# Patient Record
Sex: Female | Born: 1980 | Race: White | Hispanic: No | Marital: Married | Smoking: Never smoker
Health system: Southern US, Community
[De-identification: ages and names within clinical notes are randomized; demographics above are authoritative.]

## PROBLEM LIST (undated history)

## (undated) DIAGNOSIS — F32A Depression, unspecified: Secondary | ICD-10-CM

## (undated) HISTORY — PX: DILATION AND CURETTAGE OF UTERUS: SHX78

## (undated) HISTORY — PX: POLYPECTOMY: SHX149

---
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
## 2002-02-12 ENCOUNTER — Other Ambulatory Visit: Admission: RE | Admit: 2002-02-12 | Discharge: 2002-02-12 | Payer: Self-pay | Admitting: Gynecology

## 2002-09-26 ENCOUNTER — Encounter: Payer: Self-pay | Admitting: Emergency Medicine

## 2002-09-26 ENCOUNTER — Emergency Department (HOSPITAL_COMMUNITY): Admission: AC | Admit: 2002-09-26 | Discharge: 2002-09-26 | Payer: Self-pay

## 2006-06-30 ENCOUNTER — Ambulatory Visit (HOSPITAL_COMMUNITY): Admission: RE | Admit: 2006-06-30 | Discharge: 2006-06-30 | Payer: Self-pay | Admitting: *Deleted

## 2006-06-30 ENCOUNTER — Ambulatory Visit: Payer: Self-pay | Admitting: Vascular Surgery

## 2006-06-30 ENCOUNTER — Encounter: Payer: Self-pay | Admitting: Vascular Surgery

## 2006-12-19 ENCOUNTER — Inpatient Hospital Stay (HOSPITAL_COMMUNITY): Admission: RE | Admit: 2006-12-19 | Discharge: 2006-12-22 | Payer: Self-pay | Admitting: Obstetrics and Gynecology

## 2010-08-11 NOTE — H&P (Signed)
Jamie Oconnor, Jamie Oconnor               ACCOUNT NO.:  [id number]   MEDICAL RECORD NO.:  [id number]          PATIENT TYPE:  INP   LOCATION:  NA                            FACILITY:  [facility]   PHYSICIAN:  Guy Sandifer. Henderson Cloud, M.D. DATE OF BIRTH:  1981-01-07   DATE OF ADMISSION:  12/19/2006  DATE OF DISCHARGE:                              HISTORY & PHYSICAL   CHIEF COMPLAINT:  Large baby.   HISTORY OF PRESENT ILLNESS:  This patient is a 30 year old, married  white female, G2, P0 with an EDC of December 24, 2006.  Pregnancy has  been complicated by a false positive RPR.  Ultrasound on December 13, 2006, at 38-1/2 weeks revealed an estimated fetal weight of 9 pounds 9  ounces.  The options of management were discussed, taking into account  the anticipated growth rate of the baby and gestational age.  Her  Glucola was normal.  The patient opts for cesarean section.  Potential  risks and complications have been discussed preoperatively.   Past medical history, past surgical history, family history, obstetric  history, see prenatal history and physical.   MEDICATIONS:  Prenatal vitamins, no known drug allergies.   REVIEW OF SYSTEMS:  NEUROLOGIC:  Denies headache.  CARDIAC:  Denies  chest pain.  PULMONARY:  Denies shortness of breath.  GI:  No recent  changes in bowel habits.   PHYSICAL EXAMINATION:  Height 5 feet 9 inches, weight 288.2 pounds,  blood pressure 120/80.  LUNGS:  Clear to auscultation.  HEART:  Regular rate and rhythm.  BACK:  Without CVA tenderness.  BREASTS:  Not examined.  ABDOMEN:  Gravid, nontender.  No epigastric tenderness.  Cervix closed, thick, and high.  EXTREMITIES:  Grossly within normal limits.  NEUROLOGIC EXAM:  Grossly within normal limits.   ASSESSMENT:  Fetal macrosomia at term.   PLAN:  Cesarean section.      Guy Sandifer Henderson Cloud, M.D.  Electronically Signed     JET/MEDQ  D:  12/16/2006  T:  12/16/2006  Job:  [id number]

## 2010-08-11 NOTE — Op Note (Signed)
NAMECAITLIN, Jamie Oconnor               ACCOUNT NO.:  [id number]   MEDICAL RECORD NO.:  [id number]          PATIENT TYPE:  INP   LOCATION:  9108                          FACILITY:  [facility]   PHYSICIAN:  Guy Sandifer. Henderson Cloud, M.D. DATE OF BIRTH:  11-02-1980   DATE OF PROCEDURE:  DATE OF DISCHARGE:                               OPERATIVE REPORT   PREOPERATIVE DIAGNOSIS:  Large for gestational age baby.   POSTOPERATIVE DIAGNOSIS:  Large for gestational age baby.   PROCEDURE:  Low transverse cesarean section.   SURGEON:  Guy Sandifer. Henderson Cloud, M.D.   ANESTHESIA:  Spinal.   FINDINGS:  Viable female infant, Apgars of 9 and 9 at 1 and 5 minutes  respectively.  Birth weight 9 pounds.  Arterial cord pH pending.   ESTIMATED BLOOD LOSS:  800 cc.   INDICATIONS/CONSENT:  Patient is a 30 year old married white female, G2,  P0 with an EDC of December 24, 2006.  Ultrasound has an estimated fetal  weight of 9 pounds, 9 ounces, approximately one week ago.  After  discussion of the options, primary C-section is discussed.  Potential  risks and complications have been discussed preoperatively, including  but not limited to infection, organ damage, bleeding requiring  transfusion of blood products, possible HIV and hepatitis acquisition,  DVT, PE, pneumonia.  All questions have been answered, and consent  signed on the chart.   PROCEDURE:  Patient is taken to the operating room, where she is  identified.  A spinal anesthetic is placed, and she is placed in the  dorsal supine position with a 15 degree left lateral wedge.  She has  been prepped.  Foley catheter is placed, and the bladder is drained.  She is draped in a sterile fashion.  After assessing for adequate spinal  anesthesia, the skin is entered through a Pfannenstiel incision, and  dissection is carried out in layers in the peritoneum.  The peritoneum  is incised and taken down superiorly and inferiorly.  The vesicouterine  peritoneum is taken down  cephalolaterally.  The bladder flap is  developed, and the bladder blade is placed.  The uterus is then incised  in a low transverse manner.  The uterine cavity is entered bluntly with  a hemostat.  The uterine incision is extended cephalolaterally with the  fingers.  Artificial rupture of membranes reveals clear fluid.  The  vertex is elevated.  In order to deliver it through the incision, a  vacuum extractor is placed.  There is one pop-off with a very gentle  pull because of hair on the baby's pull.  A second very gentle attempt  easily elevates the vertex.  The oropharynx and nasopharynx are  suctioned.  The remainder of the baby is delivered.  Good cry and good  tone is noted.  The cord is clamped and cut.  The baby is handed to the  waiting pediatrics team.  The placenta is then manually delivered, and  the uterine cavity is cleaned.  The uterus is closed in two running,  locking imbricating layers with 0 Monocryl suture, which achieves good  hemostasis.  The tubes and ovaries are normal bilaterally.  The anterior  peritoneum was closed in a running fashion with a 0 Monocryl suture,  which is also used to reapproximate  the pyramidalis muscle in the midline.  Anterior rectus fascia is closed  in a running fashion with a 0 PDS suture, and the skin is closed with  clips.  All sponge and instrument counts are correct, and the patient is  transferred to the recovery room in stable condition.      Guy Sandifer Henderson Cloud, M.D.  Electronically Signed     JET/MEDQ  D:  12/19/2006  T:  12/19/2006  Job:  [id number]

## 2010-08-14 NOTE — Discharge Summary (Signed)
Jamie Oconnor, Jamie Oconnor               ACCOUNT NO.:  [id number]   MEDICAL RECORD NO.:  [id number]          PATIENT TYPE:  INP   LOCATION:  9108                          FACILITY:  [facility]   PHYSICIAN:  Freddy Finner, M.D.   DATE OF BIRTH:  [birth year]   DATE OF ADMISSION:  12/19/2006  DATE OF DISCHARGE:  12/22/2006                               DISCHARGE SUMMARY   ADMITTING DIAGNOSES:  1. Intrauterine pregnancy at term.  2. Large for gestational age infant.   DISCHARGE DIAGNOSES:  1. Status post low transverse cesarean section.  2. Viable female infant.   PROCEDURE:  Primary low transverse cesarean section.   REASON FOR ADMISSION:  Please see dictated H&P.   HOSPITAL COURSE:  The patient is a 30 year old white married female  gravida 2, para 0 that had been seen in the office approximately 1 week  prior to delivery date.  Ultrasound had been performed at that time, for  an estimated fetal weight of 9 pounds 9 ounces.  After discussion with  the patient, the decision was made to schedule a primary cesarean  delivery.   On the morning of admission, the patient was taken to the operating room  where spinal anesthesia was administered without difficulty.  A low  transverse incision was made, with delivery of a viable female infant  weighing 9 pounds; with Apgars of 9 at one minute  and 9 at 5 minutes.  The patient tolerated the procedure well and was taken to the recovery  room in stable condition.   On postoperative day #1 the patient did complain of being hungry.  Vital  signs were stable.  She was afebrile.  Abdomen was soft, with good  return of bowel function.  Fundus was firm and nontender.  Abdominal  dressing was noted to be clean, dry and intact.  Foley had been  discontinued.  She had not voided at time of rounds.  Laboratory  findings revealed hemoglobin of 9.6, platelet count 126,000, white count  of 8.1.  The patient was started on some iron supplementation. A CBC was  ordered  for the following morning.   On postoperative day #2 the patient was without complaint.  Vital signs  remained stable.  Abdomen was soft.  Fundus firm and nontender.  Incision was clean, dry and intact.  She was voiding well.   On postoperative day #3 the patient was without complaint.  Vital signs  were stable.  Abdomen was soft.  Fundus firm and nontender.  Incision  was clean, dry and intact.  Staples were removed and the patient was  later discharged home.   CONDITION ON DISCHARGE:  Stable.   DIET:  Regular as tolerated.   ACTIVITY:  No heavy lifting, no driving x2 weeks.  No vaginal entry.   FOLLOW-UP:  The patient is to follow up in the office in 1-2 weeks for  incision check.  She was to call for temperature greater than 100  degrees, persistent nausea, vomiting, heavy vaginal bleeding and/or  redness or drainage from the incisional site.   DISCHARGE MEDICATIONS:  1. Percocet  5/325 #30; one p.o. every 4-6 hours p.r.n.  2. Motrin 600 mg every 6 hours.  3. Prenatal vitamins one p.o. daily.  4. Colace one p.o. daily p.r.n.      Julio Sicks, N.P.      Freddy Finner, M.D.  Electronically Signed    CC/MEDQ  D:  01/24/2007  T:  01/25/2007  Job:  [id number]

## 2011-01-07 LAB — CBC
HCT: 28 — ABNORMAL LOW
HCT: 35.8 — ABNORMAL LOW
Hemoglobin: 12.5
Hemoglobin: 9.6 — ABNORMAL LOW
MCHC: 35
MCV: 84.6
MCV: 85.1
Platelets: 150
RBC: 4.28
WBC: 8.1

## 2011-01-07 LAB — RPR TITER: RPR Titer: 1:1 {titer}

## 2011-01-22 LAB — TPPA: Treponema Confirm: NONREACTIVE

## 2013-06-19 ENCOUNTER — Emergency Department (HOSPITAL_COMMUNITY)
Admission: EM | Admit: 2013-06-19 | Discharge: 2013-06-19 | Disposition: A | Discharge status: Home / Self Care | Payer: Self-pay | Attending: Emergency Medicine | Admitting: Emergency Medicine

## 2013-06-19 ENCOUNTER — Encounter (HOSPITAL_COMMUNITY): Payer: Self-pay | Admitting: Emergency Medicine

## 2013-06-19 DIAGNOSIS — J029 Acute pharyngitis, unspecified: Secondary | ICD-10-CM | POA: Insufficient documentation

## 2013-06-19 LAB — RAPID STREP SCREEN (MED CTR MEBANE ONLY): STREPTOCOCCUS, GROUP A SCREEN (DIRECT): NEGATIVE

## 2013-06-19 MED ORDER — SODIUM CHLORIDE 0.9 % IV BOLUS (SEPSIS)
1000.0000 mL | Freq: Once | INTRAVENOUS | Status: AC
  Administered 2013-06-19: 1000 mL via INTRAVENOUS

## 2013-06-19 MED ORDER — SODIUM CHLORIDE 0.9 % IV BOLUS (SEPSIS)
1000.0000 mL | Freq: Once | INTRAVENOUS | Status: AC
Start: 2013-06-19 — End: 2013-06-19
  Administered 2013-06-19: 1000 mL via INTRAVENOUS

## 2013-06-19 MED ORDER — MORPHINE SULFATE 4 MG/ML IJ SOLN
4.0000 mg | Freq: Once | INTRAMUSCULAR | Status: AC
  Administered 2013-06-19: 4 mg via INTRAVENOUS
  Filled 2013-06-19: qty 1

## 2013-06-19 MED ORDER — ONDANSETRON HCL 4 MG/2ML IJ SOLN
4.0000 mg | Freq: Once | INTRAMUSCULAR | Status: AC
  Administered 2013-06-19: 4 mg via INTRAVENOUS
  Filled 2013-06-19: qty 2

## 2013-06-19 MED ORDER — HYDROCODONE-ACETAMINOPHEN 7.5-325 MG/15ML PO SOLN: 10.0000 mL | Freq: Four times a day (QID) | ORAL | Status: DC | PRN

## 2013-06-19 NOTE — ED Notes (Signed)
Pt reports sore throat since Saturday. Having vomiting, fever, chills. Airway intact.

## 2013-06-19 NOTE — ED Provider Notes (Signed)
CSN: [id number]     Arrival date & time 06/19/13  [id number] History   First MD Initiated Contact with Patient 06/19/13 0915     Chief Complaint  Patient presents with  . Sore Throat  . Emesis     (Consider location/radiation/quality/duration/timing/severity/associated sxs/prior Treatment) Patient is a 33 y.o. female presenting with pharyngitis and vomiting. The history is provided by the patient. No language interpreter was used.  Sore Throat This is a new problem. Associated symptoms include a fever, myalgias, nausea, a sore throat and vomiting. Pertinent negatives include no abdominal pain, chest pain, chills, congestion or coughing. Associated symptoms comments: Significant sore throat for the past 4 days, with associated fever, nausea and vomiting. No nasal congestion or cough. No sick contacts. .  Emesis Associated symptoms: myalgias and sore throat   Associated symptoms: no abdominal pain and no chills     History reviewed. No pertinent past medical history. History reviewed. No pertinent past surgical history. History reviewed. No pertinent family history. History  Substance Use Topics  . Smoking status: Never Smoker   . Smokeless tobacco: Not on file  . Alcohol Use: No   OB History   Grav Para Term Preterm Abortions TAB SAB Ect Mult Living                 Review of Systems  Constitutional: Positive for fever. Negative for chills.  HENT: Positive for sore throat and trouble swallowing. Negative for congestion.   Respiratory: Negative.  Negative for cough and shortness of breath.   Cardiovascular: Negative.  Negative for chest pain.  Gastrointestinal: Positive for nausea and vomiting. Negative for abdominal pain.  Musculoskeletal: Positive for myalgias.  Skin: Negative.   Neurological: Negative.       Allergies  Review of patient's allergies indicates no known allergies.  Home Medications   Current Outpatient Rx  Name  Route  Sig  Dispense  Refill  .  levonorgestrel (MIRENA) 20 MCG/24HR IUD   Intrauterine   1 each by Intrauterine route once.         . Nutritional Supplements (COLD AND FLU PO)   Oral   Take 2 tablets by mouth every 6 (six) hours as needed (cold/flu).          BP 100/70  Pulse 96  Temp(Src) 98.1 F (36.7 C) (Oral)  Resp 19  Wt 260 lb (117.935 kg)  SpO2 99% Physical Exam  Constitutional: She is oriented to person, place, and time. She appears well-developed and well-nourished. No distress.  HENT:  Head: Normocephalic.  Oropharynx is erythematous without mild swelling bilaterally. Uvula midline.   Neck: Normal range of motion. Neck supple.  Cardiovascular: Normal rate and regular rhythm.   Pulmonary/Chest: Effort normal and breath sounds normal.  Abdominal: Soft. Bowel sounds are normal. There is no tenderness. There is no rebound and no guarding.  Musculoskeletal: Normal range of motion.  Neurological: She is alert and oriented to person, place, and time.  Skin: Skin is warm and dry. No rash noted.  Psychiatric: She has a normal mood and affect.    ED Course  Procedures (including critical care time) Labs Review Labs Reviewed  RAPID STREP SCREEN   Imaging Review No results found.   EKG Interpretation None      MDM   Final diagnoses:  None    1. Pharyngitis  IV fluids given with some improvement in patient's condition. VSS. Strep negative. Suspect viral process and recommend supportive care.    Ocie CornfieldShari A  Junius Finner 06/21/13 0224  Medical screening examination/treatment/procedure(s) were performed by non-physician practitioner and as supervising physician I was immediately available for consultation/collaboration.   EKG Interpretation None       Donnetta Hutching, MD 06/28/13 1600

## 2013-06-19 NOTE — Discharge Instructions (Signed)
Salt Water Gargle °This solution will help make your mouth and throat feel better. °HOME CARE INSTRUCTIONS  °· Mix 1 teaspoon of salt in 8 ounces of warm water. °· Gargle with this solution as much or often as you need or as directed. Swish and gargle gently if you have any sores or wounds in your mouth. °· Do not swallow this mixture. °Document Released: 12/18/2003 Document Revised: 06/07/2011 Document Reviewed: 05/10/2008 °[employer]® Patient Information ©2014 [employer]. ° °Sore Throat °A sore throat is pain, burning, irritation, or scratchiness of the throat. There is often pain or tenderness when swallowing or talking. A sore throat may be accompanied by other symptoms, such as coughing, sneezing, fever, and swollen neck glands. A sore throat is often the first sign of another sickness, such as a cold, flu, strep throat, or mononucleosis (commonly known as mono). Most sore throats go away without medical treatment. °CAUSES  °The most common causes of a sore throat include: °· A viral infection, such as a cold, flu, or mono. °· A bacterial infection, such as strep throat, tonsillitis, or whooping cough. °· Seasonal allergies. °· Dryness in the air. °· Irritants, such as smoke or pollution. °· Gastroesophageal reflux disease (GERD). °HOME CARE INSTRUCTIONS  °· Only take over-the-counter medicines as directed by your caregiver. °· Drink enough fluids to keep your urine clear or pale yellow. °· Rest as needed. °· Try using throat sprays, lozenges, or sucking on hard candy to ease any pain (if older than 4 years or as directed). °· Sip warm liquids, such as broth, herbal tea, or warm water with honey to relieve pain temporarily. You may also eat or drink cold or frozen liquids such as frozen ice pops. °· Gargle with salt water (mix 1 tsp salt with 8 oz of water). °· Do not smoke and avoid secondhand smoke. °· Put a cool-mist humidifier in your bedroom at night to moisten the air. You can also turn on a hot shower  and sit in the bathroom with the door closed for 5 10 minutes. °SEEK IMMEDIATE MEDICAL CARE IF: °· You have difficulty breathing. °· You are unable to swallow fluids, soft foods, or your saliva. °· You have increased swelling in the throat. °· Your sore throat does not get better in 7 days. °· You have nausea and vomiting. °· You have a fever or persistent symptoms for more than 2 3 days. °· You have a fever and your symptoms suddenly get worse. °MAKE SURE YOU:  °· Understand these instructions. °· Will watch your condition. °· Will get help right away if you are not doing well or get worse. °Document Released: 04/22/2004 Document Revised: 03/01/2012 Document Reviewed: 11/21/2011 °ExitCare® Patient Information ©2014 [employer]. ° °

## 2013-06-20 LAB — CULTURE, GROUP A STREP

## 2013-06-21 ENCOUNTER — Telehealth (HOSPITAL_COMMUNITY): Payer: Self-pay | Admitting: *Deleted

## 2013-06-21 NOTE — ED Notes (Addendum)
+   Strep Plan : Junius FinnerErin O'Malley for Amoxicillin 500 mg po BID x 10 days

## 2013-06-21 NOTE — Progress Notes (Signed)
ED Antimicrobial Stewardship Positive Culture Follow Up   Jamie Oconnor is an 33 y.o. female who [facility] with a chief complaint of  Chief Complaint  Patient presents with  . Sore Throat  . Emesis    Recent Results (from the past 720 hour(s))  RAPID STREP SCREEN     Status: None   Collection Time    06/19/13  8:54 AM      Result Value Ref Range Status   Streptococcus, Group A Screen (Direct) NEGATIVE  NEGATIVE Final   Comment: (NOTE)     A Rapid Antigen test may result negative if the antigen level in the     sample is below the detection level of this test. The FDA has not     cleared this test as a stand-alone test therefore the rapid antigen     negative result has reflexed to a Group A Strep culture.  CULTURE, GROUP A STREP     Status: None   Collection Time    06/19/13  8:54 AM      Result Value Ref Range Status   Specimen Description THROAT   Final   Special Requests NONE   Final   Culture     Final   Value: GROUP A STREP (S.PYOGENES) ISOLATED     Performed at [facility]   Report Status 06/20/2013 FINAL   Final     [x]  Patient discharged originally without antimicrobial agent and treatment is now indicated  New antibiotic prescription: amoxicillin 500mg  po BID x 10 days  ED Provider: Junius FinnerErin O'Malley PA-C   Mickeal SkinnerFrens, [place] John 06/21/2013, 9:49 AM Infectious Diseases Pharmacist Phone# [phone]

## 2013-06-24 NOTE — Telephone Encounter (Signed)
Unable to contact patient via phone. Sent letter. °

## 2013-09-20 ENCOUNTER — Telehealth (HOSPITAL_BASED_OUTPATIENT_CLINIC_OR_DEPARTMENT_OTHER): Payer: Self-pay | Admitting: Emergency Medicine

## 2015-06-27 ENCOUNTER — Emergency Department (HOSPITAL_COMMUNITY)
Admission: EM | Admit: 2015-06-27 | Discharge: 2015-06-28 | Disposition: A | Discharge status: Home / Self Care | Payer: BLUE CROSS/BLUE SHIELD | Attending: Emergency Medicine | Admitting: Emergency Medicine

## 2015-06-27 ENCOUNTER — Emergency Department (HOSPITAL_COMMUNITY): Payer: BLUE CROSS/BLUE SHIELD

## 2015-06-27 ENCOUNTER — Encounter (HOSPITAL_COMMUNITY): Payer: Self-pay | Admitting: Family Medicine

## 2015-06-27 DIAGNOSIS — R079 Chest pain, unspecified: Secondary | ICD-10-CM | POA: Insufficient documentation

## 2015-06-27 DIAGNOSIS — M7981 Nontraumatic hematoma of soft tissue: Secondary | ICD-10-CM | POA: Insufficient documentation

## 2015-06-27 DIAGNOSIS — K2970 Gastritis, unspecified, without bleeding: Secondary | ICD-10-CM | POA: Insufficient documentation

## 2015-06-27 DIAGNOSIS — Z3202 Encounter for pregnancy test, result negative: Secondary | ICD-10-CM | POA: Insufficient documentation

## 2015-06-27 DIAGNOSIS — R05 Cough: Secondary | ICD-10-CM

## 2015-06-27 DIAGNOSIS — R319 Hematuria, unspecified: Secondary | ICD-10-CM | POA: Diagnosis not present

## 2015-06-27 DIAGNOSIS — R059 Cough, unspecified: Secondary | ICD-10-CM

## 2015-06-27 DIAGNOSIS — R1084 Generalized abdominal pain: Secondary | ICD-10-CM

## 2015-06-27 DIAGNOSIS — R112 Nausea with vomiting, unspecified: Secondary | ICD-10-CM | POA: Diagnosis present

## 2015-06-27 LAB — COMPREHENSIVE METABOLIC PANEL
ALT: 21 U/L (ref 14–54)
ANION GAP: 14 (ref 5–15)
AST: 25 U/L (ref 15–41)
Albumin: 4 g/dL (ref 3.5–5.0)
Alkaline Phosphatase: 64 U/L (ref 38–126)
BILIRUBIN TOTAL: 0.4 mg/dL (ref 0.3–1.2)
BUN: 12 mg/dL (ref 6–20)
CHLORIDE: 96 mmol/L — AB (ref 101–111)
CO2: 25 mmol/L (ref 22–32)
Calcium: 9.1 mg/dL (ref 8.9–10.3)
Creatinine, Ser: 0.95 mg/dL (ref 0.44–1.00)
Glucose, Bld: 142 mg/dL — ABNORMAL HIGH (ref 65–99)
POTASSIUM: 3.1 mmol/L — AB (ref 3.5–5.1)
Sodium: 135 mmol/L (ref 135–145)
TOTAL PROTEIN: 8.5 g/dL — AB (ref 6.5–8.1)

## 2015-06-27 LAB — URINE MICROSCOPIC-ADD ON

## 2015-06-27 LAB — URINALYSIS, ROUTINE W REFLEX MICROSCOPIC
Bilirubin Urine: NEGATIVE
GLUCOSE, UA: NEGATIVE mg/dL
KETONES UR: NEGATIVE mg/dL
LEUKOCYTES UA: NEGATIVE
NITRITE: NEGATIVE
PH: 6 (ref 5.0–8.0)
Protein, ur: 30 mg/dL — AB
SPECIFIC GRAVITY, URINE: 1.026 (ref 1.005–1.030)

## 2015-06-27 LAB — CBC
HEMATOCRIT: 44.4 % (ref 36.0–46.0)
HEMOGLOBIN: 15.3 g/dL — AB (ref 12.0–15.0)
MCH: 29.8 pg (ref 26.0–34.0)
MCHC: 34.5 g/dL (ref 30.0–36.0)
MCV: 86.5 fL (ref 78.0–100.0)
Platelets: 214 10*3/uL (ref 150–400)
RBC: 5.13 MIL/uL — AB (ref 3.87–5.11)
RDW: 12.9 % (ref 11.5–15.5)
WBC: 8.6 10*3/uL (ref 4.0–10.5)

## 2015-06-27 LAB — I-STAT BETA HCG BLOOD, ED (MC, WL, AP ONLY)

## 2015-06-27 LAB — LIPASE, BLOOD: LIPASE: 29 U/L (ref 11–51)

## 2015-06-27 MED ORDER — ONDANSETRON 4 MG PO TBDP
4.0000 mg | ORAL_TABLET | Freq: Once | ORAL | Status: AC | PRN
  Administered 2015-06-27: 4 mg via ORAL

## 2015-06-27 MED ORDER — SODIUM CHLORIDE 0.9 % IV BOLUS (SEPSIS)
1000.0000 mL | Freq: Once | INTRAVENOUS | Status: AC
Start: 2015-06-27 — End: 2015-06-27
  Administered 2015-06-27: 1000 mL via INTRAVENOUS

## 2015-06-27 MED ORDER — OXYCODONE-ACETAMINOPHEN 5-325 MG PO TABS: 1.0000 | ORAL_TABLET | ORAL | Status: DC | PRN

## 2015-06-27 MED ORDER — MORPHINE SULFATE (PF) 4 MG/ML IV SOLN
4.0000 mg | Freq: Once | INTRAVENOUS | Status: AC
  Administered 2015-06-27: 4 mg via INTRAVENOUS
  Filled 2015-06-27: qty 1

## 2015-06-27 MED ORDER — DICYCLOMINE HCL 20 MG PO TABS: 20.0000 mg | ORAL_TABLET | Freq: Two times a day (BID) | ORAL | Status: DC

## 2015-06-27 MED ORDER — POTASSIUM CHLORIDE CRYS ER 20 MEQ PO TBCR
40.0000 meq | EXTENDED_RELEASE_TABLET | Freq: Once | ORAL | Status: AC
  Administered 2015-06-27: 40 meq via ORAL
  Filled 2015-06-27: qty 2

## 2015-06-27 MED ORDER — MORPHINE SULFATE (PF) 4 MG/ML IV SOLN
6.0000 mg | Freq: Once | INTRAVENOUS | Status: AC
  Administered 2015-06-27: 6 mg via INTRAVENOUS
  Filled 2015-06-27: qty 2

## 2015-06-27 MED ORDER — ACETAMINOPHEN 325 MG PO TABS
650.0000 mg | ORAL_TABLET | Freq: Once | ORAL | Status: AC
  Administered 2015-06-27: 650 mg via ORAL
  Filled 2015-06-27: qty 2

## 2015-06-27 MED ORDER — ONDANSETRON 4 MG PO TBDP: 4.0000 mg | ORAL_TABLET | Freq: Three times a day (TID) | ORAL | Status: DC | PRN

## 2015-06-27 MED ORDER — ONDANSETRON HCL 4 MG/2ML IJ SOLN
4.0000 mg | Freq: Once | INTRAMUSCULAR | Status: AC
  Administered 2015-06-27: 4 mg via INTRAVENOUS
  Filled 2015-06-27: qty 2

## 2015-06-27 MED ORDER — SODIUM CHLORIDE 0.9 % IV BOLUS (SEPSIS)
1000.0000 mL | Freq: Once | INTRAVENOUS | Status: AC
  Administered 2015-06-27: 1000 mL via INTRAVENOUS

## 2015-06-27 MED ORDER — FAMOTIDINE IN NACL 20-0.9 MG/50ML-% IV SOLN
20.0000 mg | Freq: Once | INTRAVENOUS | Status: AC
  Administered 2015-06-27: 20 mg via INTRAVENOUS
  Filled 2015-06-27: qty 50

## 2015-06-27 MED ORDER — ONDANSETRON 4 MG PO TBDP
ORAL_TABLET | ORAL | Status: AC
  Filled 2015-06-27: qty 1

## 2015-06-27 MED ORDER — IOHEXOL 300 MG/ML  SOLN
100.0000 mL | Freq: Once | INTRAMUSCULAR | Status: AC | PRN
  Administered 2015-06-27: 100 mL via INTRAVENOUS

## 2015-06-27 MED ORDER — FAMOTIDINE 20 MG PO TABS: 20.0000 mg | ORAL_TABLET | Freq: Two times a day (BID) | ORAL | Status: DC

## 2015-06-27 MED ORDER — PANTOPRAZOLE SODIUM 20 MG PO TBEC: 20.0000 mg | DELAYED_RELEASE_TABLET | Freq: Every day | ORAL | Status: DC

## 2015-06-27 NOTE — Discharge Instructions (Signed)
You have been seen today for abdominal pain and other related symptoms. Your CAT scan showed inflammation in the stomach, which may account for her symptoms. The start of this may have been from a virus, such as influenza.  Drink plenty of fluids and get plenty of rest. You should be drinking at least a liter of water an hour to stay hydrated. Take the protonix 30 min before your first meal. Take this medication every day, even if you start to feel better. Follow up with PCP as soon as possible for reevaluation and chronic management. Return to ED should symptoms worsen.  RESOURCE GUIDE  Chronic Pain Problems: Contact [facility]  [phone] Patients need to be referred by their primary care doctor.  Insufficient Money for Medicine: Contact United Way:  call "211" or [facility] 662-560-2196.  No Primary Care Doctor: - Call [facility]  [phone] - can help you locate a primary care doctor that  accepts your insurance, provides certain services, etc. - Physician Referral Service- [phone]  Agencies that provide inexpensive medical care: - [facility]  [phone] - [facility]  [phone] - [facility]  [phone] - Women's Clinic  [phone] - Planned Parenthood  [phone] [facility]  [phone]  Medicaid-accepting [facility] Providers: - [facility]- [address]  [phone], Mon-Fri 9am-7pm, Sat 9am-1pm - [facility]- [address] [place], [address] [place]  [phone] - [facility]- [address], [address] [place]  [phone] [facility]- [address]  [phone] - Renaye Rakers- [address] [place], [address], [phone]  Only accepts [place] patients after they have their name  applied to their card  Self Pay (no insurance) in [place]: - Sickle Cell Patients: Dr Willey Blade, [facility]  Internal Medicine  [address] [place], [phone] - [facility]- [address] [place]  [phone]       [facility] [place]- [address]       -     [facility]- see information above (Speak to [employer] if you do not have insurance)       -  [facility]- [address] [place], [phone]       -  [facility]- [address],  [phone]       -  [facility]- [address] [phone]       -  Dr Julio Sicks-  [address], [place], [phone]       -  [facility]- [address] [phone]       -  [facility]- [address], [phone], also [address], [phone]       -    [facility]- [address] [place], [phone], 1st & 3rd Saturday   every month, 10am-1pm  1) Find a Doctor and Pay Out of Pocket Although you won't have to find out who is covered by your insurance plan, it is a good idea to ask around and get recommendations. You will then need to call the office and see if the doctor you have chosen will accept you as a new patient and what types of options they offer for patients who are self-pay.  Some doctors offer discounts or will set up payment plans for their patients who do not have insurance, but you will need to ask so you aren't surprised when you get to your appointment.  2) Contact Your Local Health Department Not all health departments have doctors that can see patients for sick visits, but many do, so it is worth a call to see if yours does. If you don't know where your local health department is, you can check in your phone book. The CDC also has a tool to help you locate your state's health department, and many state websites also have listings of all of their local health departments.  3) Find a Walk-in Clinic If your illness is not likely to be very severe or complicated, you may want to try a walk in clinic. These are popping up all  over the country in pharmacies, drugstores, and shopping centers. They're usually staffed by nurse practitioners or physician assistants that have been trained to treat common illnesses and complaints. They're usually fairly quick and inexpensive. However, if you have serious medical issues or chronic medical problems, these are probably not your best option  STD Testing - [facility] [place], STD Clinic, [address], [place], phone [phone] or [phone].  Monday - Friday, call for an appointment. [facility] [place] [facility] [place], STD Clinic, [place], [place], phone [phone] or [phone].  Monday - Friday, call for an appointment.  Abuse/Neglect: [facility] [place] Child Abuse Hotline 360-334-4238(336) 732-709-0907 [facility]- [place] Child Abuse Hotline 5624610231973-866-7748 (After Hours)  Emergency Shelter:  [facility] 339-246-0172(336) [phone]  Maternity Homes: - [facility] at the [place] [facility] [phone](336) [phone] - [facility] [phone]) [phone]  MRSA Hotline #:   503 838 41116672041253  [facility] Resources  [facility] [place]  United Way [facility] [address]                 [address]         [address] [place] [address]  [place]                                               [place]                              [place] Phone:  [phone]                                  Phone:  [phone]                   Phone:  [phone]  [facility], [phone] - [facility] Services - [phone]- [phone]       -     [facility] in [place], [address],                                  [phone], Insurance  [place] Child Abuse Hotline (807)674-8328(336) 613 137 7335 or 3314867013(336) 779-615-2707 (After Hours)   Behavioral Health Services  Substance Abuse Resources: - Alcohol and Drug Services  [phone] - Addiction  [facility] (740)[phone] - [facility] [place] [phone] Floydene Flock [phone] - Residential & Outpatient Substance Abuse Program  [phone]  Psychological Services: [facility]  [phone] Services  [phone] - [facility], [phone] [place]. [address] [place], ACCESS LINE: 2527144775 or (651)700-3445, [employer]  Dental Assistance  If unable to pay or uninsured, contact:  [facility] or [facility]. to become qualified for the adult dental clinic.  Patients with Medicaid: [facility] [phone] [address], [phone] [address] [phone]  If unable to pay, or uninsured, contact [facility] (660) [phone]) or [facility] [phone] in [place], [phone] in [facility]) to become qualified for the adult dental clinic   Other Low-Cost Community Dental Services: - [facility]- [address] [place], [place], [place], [address], [phone], [address]. [address], 2nd and 4th Thursday of the month at 6:30am.  10 clients each day by appointment, can sometimes see walk-in patients if someone does not show for an appointment. [facility]- [address] Ether Griffins [place], [place], [address], [phone] - [facility]- [address] [place], [place], [address], [phone] - [facility]- [phone] [facility] Health Department- [phone] [facility] Department- [phone]

## 2015-06-27 NOTE — ED Provider Notes (Signed)
CSN: [id number]     Arrival date & time 06/27/15  1707 History  By signing my name below, I, Placido SouLogan Joldersma, attest that this documentation has been prepared under the direction and in the presence of Fayrene HelperBowie Shontay Wallner, PA-C. Electronically Signed: Placido SouLogan Joldersma, ED Scribe. 06/27/2015. 7:28 PM.    Chief Complaint  Patient presents with  . Chest Pain  . Emesis  . Cough   The history is provided by the patient and the spouse. No language interpreter was used.   HPI Comments: Jamie Oconnor is a 35 y.o. female who presents to the Emergency Department complaining of constant, moderate, epigastric pain onset 4 days ago. She reports associated nausea, >10x vomiting per day with intermittent traces of blood, intermittent diarrhea, mild subjective fever (99.7 F in triage), chills, productive cough and atraumatic left breast tenderness and bruising. She denies taking any medications for her symptoms. Pt notes a SHx to her abd including a cesarean section. Pt notes that her partner was recently dx with pneumonia. She denies a hx of smoking, ETOH abuse or any known health conditions. Pt denies urinary symptoms or other associated symptoms at this time.    History reviewed. No pertinent past medical history. Past Surgical History  Procedure Laterality Date  . Cesarean section     History reviewed. No pertinent family history. Social History  Substance Use Topics  . Smoking status: Never Smoker   . Smokeless tobacco: None  . Alcohol Use: No   OB History    No data available     Review of Systems  Constitutional: Positive for fever and chills.  Respiratory: Positive for cough.   Gastrointestinal: Positive for nausea, vomiting and diarrhea.  Genitourinary: Negative for dysuria and hematuria.  Musculoskeletal: Positive for myalgias.  Skin: Positive for color change.  All other systems reviewed and are negative.  Allergies  Review of patient's allergies indicates no known allergies.  Home  Medications   Prior to Admission medications   Medication Sig Start Date End Date Taking? Authorizing Provider  HYDROcodone-acetaminophen (HYCET) 7.5-325 mg/15 ml solution Take 10 mLs by mouth 4 (four) times daily as needed for moderate pain. 06/19/13   Elpidio AnisShari Upstill, PA-C  levonorgestrel (MIRENA) 20 MCG/24HR IUD 1 each by Intrauterine route once.    Historical Provider, MD  Nutritional Supplements (COLD AND FLU PO) Take 2 tablets by mouth every 6 (six) hours as needed (cold/flu).    Historical Provider, MD   BP 148/84 mmHg  Pulse 122  Temp(Src) 99.7 F (37.6 C) (Oral)  Resp 20  SpO2 97% Physical Exam  Constitutional: She is oriented to person, place, and time. She appears well-developed and well-nourished.  HENT:  Head: Normocephalic and atraumatic.  Eyes: EOM are normal.  Neck: Normal range of motion.  Cardiovascular: Normal rate and regular rhythm.  Exam reveals no gallop and no friction rub.   No murmur heard. Pulmonary/Chest: Effort normal and breath sounds normal. No respiratory distress. She has no wheezes. She has no rales.  Abdominal: Soft. There is tenderness. There is no rebound and no guarding.  Diffuse abd tenderness without guarding or rebound  Musculoskeletal: Normal range of motion.  Faint bruising noted to medial aspect of left breast without any signs of infection  Neurological: She is alert and oriented to person, place, and time.  Skin: Skin is warm and dry.  Psychiatric: She has a normal mood and affect.  Nursing note and vitals reviewed.  ED Course  Procedures  DIAGNOSTIC STUDIES: Oxygen Saturation is  97% on RA, normal by my interpretation.    COORDINATION OF CARE: 8:05 PM Pt presents today due to abd pain.  Since pt report cough and recent sick contact, CXR ordered. She also have diffused abd pain, Discussed treatment plan with pt at bedside including Ct scan, pain medication, anti nausea medication and IV fluids. Pt agreed to plan.  10:50 PM UA shows  moderate hemoglobin on urine dipsticks however patient is currently not on her menstrual period she is on an IUD.  Patient chest x-ray shows no signs of pneumonia. No evidence of leukocytosis. Mild hypokalemia with potassium of 3.1, supplementation given.  11:07 PM Care discussed with Harolyn Rutherford, PA-C who will f/u on the abd/pelvis CT scan and will reassess dispo pending result.    Labs Review Labs Reviewed  COMPREHENSIVE METABOLIC PANEL - Abnormal; Notable for the following:    Potassium 3.1 (*)    Chloride 96 (*)    Glucose, Bld 142 (*)    Total Protein 8.5 (*)    All other components within normal limits  CBC - Abnormal; Notable for the following:    RBC 5.13 (*)    Hemoglobin 15.3 (*)    All other components within normal limits  URINALYSIS, ROUTINE W REFLEX MICROSCOPIC (NOT AT [facility]) - Abnormal; Notable for the following:    Color, Urine AMBER (*)    APPearance CLOUDY (*)    Hgb urine dipstick MODERATE (*)    Protein, ur 30 (*)    All other components within normal limits  URINE MICROSCOPIC-ADD ON - Abnormal; Notable for the following:    Squamous Epithelial / LPF 6-30 (*)    Bacteria, UA FEW (*)    All other components within normal limits  LIPASE, BLOOD  I-STAT BETA HCG BLOOD, ED ([facility], [facility], [facility] ONLY)    Imaging Review Dg Chest 2 View  06/27/2015  CLINICAL DATA:  Vomiting for 4 days, bruised breast for 3 days, LEFT upper abdominal pain for 2 days, coughing up mucus today EXAM: CHEST  2 VIEW COMPARISON:  None FINDINGS: Normal heart size, mediastinal contours, and pulmonary vascularity. Lungs clear. No pleural effusion or pneumothorax. Bones unremarkable. IMPRESSION: No acute abnormalities. Electronically Signed   By: Ulyses Southward M.D.   On: 06/27/2015 21:06   Ct Abdomen Pelvis W Contrast  06/27/2015  CLINICAL DATA:  Left-sided flank pain. Nausea vomiting and diarrhea since Tuesday. EXAM: CT ABDOMEN AND PELVIS WITH CONTRAST TECHNIQUE: Multidetector CT imaging of the abdomen and  pelvis was performed using the standard protocol following bolus administration of intravenous contrast. CONTRAST:  OMNIPAQUE IOHEXOL 300 MG/ML  SOLN COMPARISON:  None. FINDINGS: Lower chest: Small periesophageal nodes are favored to be reactive. Clear lung bases. Normal heart size without pericardial or pleural effusion. Hepatobiliary: Normal liver. Normal gallbladder, without biliary ductal dilatation. Pancreas: Normal, without mass or ductal dilatation. Spleen: Normal in size, without focal abnormality. Adrenals/Urinary Tract: Normal adrenal glands. Left renal sinus cysts. Normal right kidney, without hydronephrosis or hydroureter. Normal urinary bladder. Stomach/Bowel: Apparent gastric body wall thickening, mild, including on image 29/series 2. Normal colon, appendix, and terminal ileum. Normal small bowel. Vascular/Lymphatic: Normal caliber of the aorta and branch vessels. No abdominopelvic adenopathy. Reproductive: Intrauterine device. Retroverted uterus. No adnexal mass. Other: No significant free fluid. Musculoskeletal: Degenerative disc disease, including at the lumbosacral junction. IMPRESSION: 1. Apparent gastric body wall thickening, mild. Cannot exclude gastritis. 2. Otherwise, no explanation for abdominal pain. Electronically Signed   By: Hosie Spangle.D.  On: 06/27/2015 23:20   I have personally reviewed and evaluated these images and lab results as part of my medical decision-making.   EKG Interpretation None     ED ECG REPORT   Date: 06/27/2015  Rate: 117  Rhythm: sinus tachycardia  QRS Axis: normal  Intervals: normal  ST/T Wave abnormalities: normal  Conduction Disutrbances:none  Narrative Interpretation: inverted p-waves, likely lead placement.  Old EKG Reviewed: unchanged  I have personally reviewed the EKG tracing and agree with the computerized printout as noted.   MDM   Final diagnoses:  Cough  Generalized abdominal pain  Hematuria    BP 118/69 mmHg   Pulse 98  Temp(Src) 100 F (37.8 C) (Oral)  Resp 18  SpO2 94%  I personally performed the services described in this documentation, which was scribed in my presence. The recorded information has been reviewed and is accurate.      Fayrene Helper, PA-C 06/28/15 1258  Gwyneth Sprout, MD 06/30/15 [phone]

## 2015-06-27 NOTE — ED Notes (Signed)
Pt here for vomiting, chest pain x a few days.

## 2015-06-27 NOTE — ED Provider Notes (Signed)
Jamie Oconnor is a 35 y.o. female, patient with no pertinent past medical history, presenting to the ED with epigastric pain that began 4 days ago associated with nausea and vomiting. Patient rates her pain at around a 7 out of 10, stabbing, nonradiating. Patient also endorses intermittent diarrhea, subjective fever, chills, productive cough, and left breast tenderness and bruising. Patient has not taken anything for her symptoms. Patient denies fever/chills, chest pain, shortness of breath, or any other complaints. Patient also denies any previous instances of abnormal bleeding.   Fayrene HelperBowie Tran, PA-C HPI: "Jamie Oconnor is a 35 y.o. female who presents to the Emergency Department complaining of constant, moderate, epigastric pain onset 4 days ago. She reports associated nausea, >10x vomiting per day with intermittent traces of blood, intermittent diarrhea, mild subjective fever (99.7 F in triage), chills, productive cough and atraumatic left breast tenderness and bruising. She denies taking any medications for her symptoms. Pt notes a SHx to her abd including a cesarean section. Pt notes that her partner was recently dx with pneumonia. She denies a hx of smoking, ETOH abuse or any known health conditions. Pt denies urinary symptoms or other associated symptoms at this time."  History reviewed. No pertinent past medical history.  Physical Exam  BP 118/69 mmHg  Pulse 98  Temp(Src) 100 F (37.8 C) (Oral)  Resp 18  SpO2 94%  Physical Exam  Constitutional: She appears well-developed and well-nourished. No distress.  HENT:  Head: Normocephalic and atraumatic.  Eyes: Conjunctivae are normal. Pupils are equal, round, and reactive to light.  Neck: Neck supple.  Cardiovascular: Normal rate, regular rhythm, normal heart sounds and intact distal pulses.   Pulmonary/Chest: Effort normal and breath sounds normal. No respiratory distress.  Abdominal: Soft. There is tenderness in the left upper quadrant. There  is no guarding and no CVA tenderness.  Musculoskeletal: She exhibits no edema or tenderness.  Lymphadenopathy:    She has no cervical adenopathy.  Neurological: She is alert.  Skin: Skin is warm and dry. She is not diaphoretic.  Psychiatric: She has a normal mood and affect. Her behavior is normal.  Nursing note and vitals reviewed.   ED Course  Procedures   Results for orders placed or performed during the hospital encounter of 06/27/15  Lipase, blood  Result Value Ref Range   Lipase 29 11 - 51 U/L  Comprehensive metabolic panel  Result Value Ref Range   Sodium 135 135 - 145 mmol/L   Potassium 3.1 (L) 3.5 - 5.1 mmol/L   Chloride 96 (L) 101 - 111 mmol/L   CO2 25 22 - 32 mmol/L   Glucose, Bld 142 (H) 65 - 99 mg/dL   BUN 12 6 - 20 mg/dL   Creatinine, Ser 0.980.95 0.44 - 1.00 mg/dL   Calcium 9.1 8.9 - 11.910.3 mg/dL   Total Protein 8.5 (H) 6.5 - 8.1 g/dL   Albumin 4.0 3.5 - 5.0 g/dL   AST 25 15 - 41 U/L   ALT 21 14 - 54 U/L   Alkaline Phosphatase 64 38 - 126 U/L   Total Bilirubin 0.4 0.3 - 1.2 mg/dL   GFR calc non Af Amer >60 >60 mL/min   GFR calc Af Amer >60 >60 mL/min   Anion gap 14 5 - 15  CBC  Result Value Ref Range   WBC 8.6 4.0 - 10.5 K/uL   RBC 5.13 (H) 3.87 - 5.11 MIL/uL   Hemoglobin 15.3 (H) 12.0 - 15.0 g/dL   HCT 14.744.4 82.936.0 -  46.0 %   MCV 86.5 78.0 - 100.0 fL   MCH 29.8 26.0 - 34.0 pg   MCHC 34.5 30.0 - 36.0 g/dL   RDW 16.1 09.6 - 04.5 %   Platelets 214 150 - 400 K/uL  Urinalysis, Routine w reflex microscopic (not at [facility])  Result Value Ref Range   Color, Urine AMBER (A) YELLOW   APPearance CLOUDY (A) CLEAR   Specific Gravity, Urine 1.026 1.005 - 1.030   pH 6.0 5.0 - 8.0   Glucose, UA NEGATIVE NEGATIVE mg/dL   Hgb urine dipstick MODERATE (A) NEGATIVE   Bilirubin Urine NEGATIVE NEGATIVE   Ketones, ur NEGATIVE NEGATIVE mg/dL   Protein, ur 30 (A) NEGATIVE mg/dL   Nitrite NEGATIVE NEGATIVE   Leukocytes, UA NEGATIVE NEGATIVE  Urine microscopic-add on  Result  Value Ref Range   Squamous Epithelial / LPF 6-30 (A) NONE SEEN   WBC, UA 0-5 0 - 5 WBC/hpf   RBC / HPF 0-5 0 - 5 RBC/hpf   Bacteria, UA FEW (A) NONE SEEN  I-Stat beta hCG blood, ED ([facility], [facility], [facility] only)  Result Value Ref Range   I-stat hCG, quantitative <5.0 <5 mIU/mL   Comment 3           Dg Chest 2 View  06/27/2015  CLINICAL DATA:  Vomiting for 4 days, bruised breast for 3 days, LEFT upper abdominal pain for 2 days, coughing up mucus today EXAM: CHEST  2 VIEW COMPARISON:  None FINDINGS: Normal heart size, mediastinal contours, and pulmonary vascularity. Lungs clear. No pleural effusion or pneumothorax. Bones unremarkable. IMPRESSION: No acute abnormalities. Electronically Signed   By: Ulyses Southward M.D.   On: 06/27/2015 21:06   Ct Abdomen Pelvis W Contrast  06/27/2015  CLINICAL DATA:  Left-sided flank pain. Nausea vomiting and diarrhea since Tuesday. EXAM: CT ABDOMEN AND PELVIS WITH CONTRAST TECHNIQUE: Multidetector CT imaging of the abdomen and pelvis was performed using the standard protocol following bolus administration of intravenous contrast. CONTRAST:  OMNIPAQUE IOHEXOL 300 MG/ML  SOLN COMPARISON:  None. FINDINGS: Lower chest: Small periesophageal nodes are favored to be reactive. Clear lung bases. Normal heart size without pericardial or pleural effusion. Hepatobiliary: Normal liver. Normal gallbladder, without biliary ductal dilatation. Pancreas: Normal, without mass or ductal dilatation. Spleen: Normal in size, without focal abnormality. Adrenals/Urinary Tract: Normal adrenal glands. Left renal sinus cysts. Normal right kidney, without hydronephrosis or hydroureter. Normal urinary bladder. Stomach/Bowel: Apparent gastric body wall thickening, mild, including on image 29/series 2. Normal colon, appendix, and terminal ileum. Normal small bowel. Vascular/Lymphatic: Normal caliber of the aorta and branch vessels. No abdominopelvic adenopathy. Reproductive: Intrauterine device. Retroverted  uterus. No adnexal mass. Other: No significant free fluid. Musculoskeletal: Degenerative disc disease, including at the lumbosacral junction. IMPRESSION: 1. Apparent gastric body wall thickening, mild. Cannot exclude gastritis. 2. Otherwise, no explanation for abdominal pain. Electronically Signed   By: Jeronimo Greaves M.D.   On: 06/27/2015 23:20   Medications  ondansetron (ZOFRAN-ODT) disintegrating tablet 4 mg (4 mg Oral Given 06/27/15 1721)  sodium chloride 0.9 % bolus 1,000 mL (0 mLs Intravenous Stopped 06/27/15 2319)  morphine 4 MG/ML injection 6 mg (6 mg Intravenous Given 06/27/15 2114)  ondansetron (ZOFRAN) injection 4 mg (4 mg Intravenous Given 06/27/15 2114)  acetaminophen (TYLENOL) tablet 650 mg (650 mg Oral Given 06/27/15 2159)  potassium chloride SA (K-DUR,KLOR-CON) CR tablet 40 mEq (40 mEq Oral Given 06/27/15 2309)  iohexol (OMNIPAQUE) 300 MG/ML solution 100 mL (100 mLs Intravenous Contrast Given 06/27/15  2239)  morphine 4 MG/ML injection 4 mg (4 mg Intravenous Given 06/27/15 2319)  ondansetron (ZOFRAN) injection 4 mg (4 mg Intravenous Given 06/27/15 2319)  sodium chloride 0.9 % bolus 1,000 mL (1,000 mLs Intravenous New Bag/Given 06/27/15 2320)  famotidine (PEPCID) IVPB 20 mg premix (20 mg Intravenous New Bag/Given 06/27/15 2346)     MDM Jamie Oconnor presents with abdominal pain for the last 4 days. Accompanied by nausea, vomiting, subjective fever, cough, and diarrhea.  11:08 PM Took patient care handoff report from Fayrene Helper, PA-C. Plan: Follow up on abdominal CT findings. If normal, offer reassurance and symptomatic treatment and discharge.  Due to the patient's pain and hematuria, abdominal CT scan was obtained. Patient's pain was controlled with IV morphine. Patient is nontoxic appearing, afebrile, not tachycardic, not tachypneic, maintains adequate SPO2 on room air, and is in no apparent distress. Patient has no signs of sepsis or other serious or life-threatening condition. CT scan  shows evidence of gastritis, which would correlate clinically with the patient's symptoms. Suspect that the patient had a viral illness that began to cause the gastritis. Patient treated with Pepcid IV and IV fluids here in the ED. Home prescriptions for Protonix, Pepcid, Zofran, Bentyl, and Percocet. Patient advised to select and follow-up with PCP next week. Return precautions discussed. Patient voiced understanding of these instructions, agrees to the plan, and is comfortable with discharge. Patient appears safe for discharge this time.  Filed Vitals:   06/27/15 1709 06/27/15 2121  BP: 148/84 118/69  Pulse: 122 98  Temp: 99.7 F (37.6 C) 100 F (37.8 C)  TempSrc: Oral Oral  Resp: 20 18  SpO2: 97% 94%     Anselm Pancoast, PA-C 06/28/15 0039  Eber Hong, MD 06/28/15 1242

## 2016-06-22 ENCOUNTER — Emergency Department (HOSPITAL_COMMUNITY)
Admission: EM | Admit: 2016-06-22 | Discharge: 2016-06-22 | Disposition: A | Discharge status: Home / Self Care | Payer: BLUE CROSS/BLUE SHIELD | Attending: Emergency Medicine | Admitting: Emergency Medicine

## 2016-06-22 ENCOUNTER — Encounter (HOSPITAL_COMMUNITY): Payer: Self-pay

## 2016-06-22 DIAGNOSIS — H6501 Acute serous otitis media, right ear: Secondary | ICD-10-CM | POA: Insufficient documentation

## 2016-06-22 DIAGNOSIS — Z79899 Other long term (current) drug therapy: Secondary | ICD-10-CM | POA: Insufficient documentation

## 2016-06-22 DIAGNOSIS — H60501 Unspecified acute noninfective otitis externa, right ear: Secondary | ICD-10-CM

## 2016-06-22 DIAGNOSIS — H6092 Unspecified otitis externa, left ear: Secondary | ICD-10-CM | POA: Insufficient documentation

## 2016-06-22 MED ORDER — CIPROFLOXACIN-HYDROCORTISONE 0.2-1 % OT SUSP: 3.0000 [drp] | Freq: Two times a day (BID) | OTIC | 0 refills | Status: AC

## 2016-06-22 MED ORDER — IBUPROFEN 800 MG PO TABS
800.0000 mg | ORAL_TABLET | Freq: Once | ORAL | Status: AC
  Administered 2016-06-22: 800 mg via ORAL
  Filled 2016-06-22: qty 1

## 2016-06-22 MED ORDER — IBUPROFEN 800 MG PO TABS: 800.0000 mg | ORAL_TABLET | Freq: Three times a day (TID) | ORAL | 0 refills | Status: DC

## 2016-06-22 MED ORDER — AMOXICILLIN 500 MG PO CAPS: 1000.0000 mg | ORAL_CAPSULE | Freq: Three times a day (TID) | ORAL | 0 refills | Status: AC

## 2016-06-22 NOTE — Discharge Instructions (Signed)
Take your antibiotics as prescribe. You may use ibuprofen for pain and warm compresses. Follow up with ENT if your symptoms worsen or do not improve after a few days. Return to the Emergency department if you experience any new concerning symptoms.

## 2016-06-22 NOTE — ED Notes (Signed)
c/o right ear pain onset this am states it feels like someone is stabbing knives in her right ear. , very slight drainage from her ear. . Denies recent congestion

## 2016-06-22 NOTE — ED Triage Notes (Signed)
Pt states she woke up with stabbing pain in her right ear. She describes as knives. Pt also reports some drainage from the ear.

## 2016-06-22 NOTE — ED Provider Notes (Signed)
[facility]-EMERGENCY DEPT Provider Note   CSN: [id number] Arrival date & time: 06/22/16  4098     History   Chief Complaint Chief Complaint  Patient presents with  . Otalgia    HPI Jamie Oconnor is a 36 y.o. female presenting with sudden onset of sharp stabbing right ear pain. She states that she had a similar episode about 2 months ago but the pain subsided. She has not tried anything for the pain. She denies fever, chills, nausea, vomiting cold symptoms, sore throat or other symptoms.  HPI  History reviewed. No pertinent past medical history.  There are no active problems to display for this patient.   Past Surgical History:  Procedure Laterality Date  . CESAREAN SECTION      OB History    No data available       Home Medications    Prior to Admission medications   Medication Sig Start Date End Date Taking? Authorizing Provider  amoxicillin (AMOXIL) 500 MG capsule Take 2 capsules (1,000 mg total) by mouth 3 (three) times daily. 06/22/16 07/02/16  Georgiana Shore, PA-C  ciprofloxacin-hydrocortisone (CIPRO [facility]) otic suspension Place 3 drops into the right ear 2 (two) times daily. 06/22/16 06/29/16  Georgiana Shore, PA-C  dicyclomine (BENTYL) 20 MG tablet Take 1 tablet (20 mg total) by mouth 2 (two) times daily. 06/27/15   Shawn C Joy, PA-C  famotidine (PEPCID) 20 MG tablet Take 1 tablet (20 mg total) by mouth 2 (two) times daily. 06/27/15   Shawn C Joy, PA-C  ibuprofen (ADVIL,MOTRIN) 800 MG tablet Take 1 tablet (800 mg total) by mouth 3 (three) times daily. 06/22/16   Georgiana Shore, PA-C  levonorgestrel (MIRENA) 20 MCG/24HR IUD 1 each by Intrauterine route once.    Historical Provider, MD  ondansetron (ZOFRAN ODT) 4 MG disintegrating tablet Take 1 tablet (4 mg total) by mouth every 8 (eight) hours as needed for nausea or vomiting. 06/27/15   Shawn C Joy, PA-C  oxyCODONE-acetaminophen (PERCOCET/ROXICET) 5-325 MG tablet Take 1-2 tablets by mouth every 4 (four) hours as needed  for severe pain. 06/27/15   Shawn C Joy, PA-C  pantoprazole (PROTONIX) 20 MG tablet Take 1 tablet (20 mg total) by mouth daily. 06/27/15   Anselm Pancoast, PA-C    Family History History reviewed. No pertinent family history.  Social History Social History  Substance Use Topics  . Smoking status: Never Smoker  . Smokeless tobacco: Never Used  . Alcohol use Yes     Comment: rare     Allergies   Patient has no known allergies.   Review of Systems Review of Systems  Constitutional: Negative for chills and fever.  HENT: Positive for ear pain. Negative for congestion, dental problem, drooling, hearing loss, sinus pain, sinus pressure, sore throat, trouble swallowing and voice change.   Eyes: Negative for pain and visual disturbance.  Respiratory: Negative for cough, choking, shortness of breath, wheezing and stridor.   Cardiovascular: Negative for chest pain and palpitations.  Gastrointestinal: Negative for nausea and vomiting.  Musculoskeletal: Negative for neck pain and neck stiffness.  Skin: Negative for color change, pallor, rash and wound.  Neurological: Negative for dizziness, light-headedness and headaches.     Physical Exam Updated Vital Signs BP 132/71 (BP Location: Left Arm)   Pulse 66   Temp 98.8 F (37.1 C) (Oral)   Resp 17   SpO2 99%   Physical Exam  Constitutional: She appears well-developed and well-nourished. No distress.  Patient is afebrile,  nontoxic-appearing, lying in bed in apparent discomfort.  HENT:  Head: Normocephalic and atraumatic.  Left Ear: External ear normal.  Mouth/Throat: Oropharynx is clear and moist. No oropharyngeal exudate.  Left external ear and tympanic membrane normal.  Right external ear normal, clear drainage from the right ear canal. Pain with palpation of the tract is and pulling on the pinna. Pain appears somewhat out of proportion. Patient was very reluctant to having the ear examined. Right Tympanic membrane is erythematous and  bulging.   Eyes: Conjunctivae and EOM are normal. Right eye exhibits no discharge. Left eye exhibits no discharge.  Neck: Normal range of motion. Neck supple.  Cardiovascular: Normal rate, regular rhythm and normal heart sounds.   No murmur heard. Pulmonary/Chest: Effort normal and breath sounds normal. No respiratory distress. She has no wheezes. She has no rales. She exhibits no tenderness.  Musculoskeletal: She exhibits no edema.  Lymphadenopathy:    She has no cervical adenopathy.  Neurological: She is alert.  Skin: Skin is warm and dry. No rash noted. She is not diaphoretic. No erythema. No pallor.  Psychiatric: She has a normal mood and affect.  Nursing note and vitals reviewed.    ED Treatments / Results  Labs (all labs ordered are listed, but only abnormal results are displayed) Labs Reviewed - No data to display  EKG  EKG Interpretation None       Radiology No results found.  Procedures Procedures (including critical care time)  Medications Ordered in ED Medications  ibuprofen (ADVIL,MOTRIN) tablet 800 mg (800 mg Oral Given 06/22/16 0933)     Initial Impression / Assessment and Plan / ED Course  I have reviewed the triage vital signs and the nursing notes.  Pertinent labs & imaging results that were available during my care of the patient were reviewed by me and considered in my medical decision making (see chart for details).     Patient presents with sudden onset sharp excruciating pain in her right ear. On exam she has signs of possible otitis externa with pain pressing on the tragus or pulling on the pinna and drainage. She also has a bulging erythematous tympanic membrane with discomfort radiating down in her throat more consistent with otitis media and her pain out of proportion To suspect bullous myringitis although no clear vesicles were visualized. Exam is otherwise unremarkable, she is afebrile, nontoxic-appearing.  We'll treat with otic drops and  amoxicillin and pain medicine with warm compresses and close follow-up with PCP. Follow-up with ENT if symptoms persist.  Discussed strict return precautions and advised to return to the emergency department if experiencing any new or worsening symptoms. Instructions were understood and patient agreed with discharge plan.  Final Clinical Impressions(s) / ED Diagnoses   Final diagnoses:  Right acute serous otitis media, recurrence not specified  Acute otitis externa of right ear, unspecified type    New Prescriptions Discharge Medication List as of 06/22/2016  9:28 AM    START taking these medications   Details  amoxicillin (AMOXIL) 500 MG capsule Take 2 capsules (1,000 mg total) by mouth 3 (three) times daily., Starting Tue 06/22/2016, Until Fri 07/02/2016, Print    ciprofloxacin-hydrocortisone (CIPRO [facility]) otic suspension Place 3 drops into the right ear 2 (two) times daily., Starting Tue 06/22/2016, Until Tue 06/29/2016, Print    ibuprofen (ADVIL,MOTRIN) 800 MG tablet Take 1 tablet (800 mg total) by mouth 3 (three) times daily., Starting Tue 06/22/2016, Print         Shanda BumpsJessica B  Clovis Riley, PA-C 06/22/16 1017    Alvira Monday, MD 06/26/16 [phone]

## 2017-10-13 IMAGING — CT CT ABD-PELV W/ CM
2 of 4 series · 16 of 46 positions shown, 18 images · IV contrast (APPLIED)
Comparison: None.

CLINICAL DATA: Left-sided flank pain. Nausea vomiting and diarrhea
since [REDACTED].

EXAM:
CT ABDOMEN AND PELVIS WITH CONTRAST
TECHNIQUE: Multidetector CT imaging of the abdomen and pelvis was performed
using the standard protocol following bolus administration of
intravenous contrast.
CONTRAST:  100mL OMNIPAQUE IOHEXOL 300 MG/ML  SOLN

[Series 2: abd/ pelvis 5.0 i30f 1 · axial · 0.87mm/px · z∈[+891,+1376]mm · 13 of 107 slices shown, 15 images]
[im 5/107  soft-tissue]
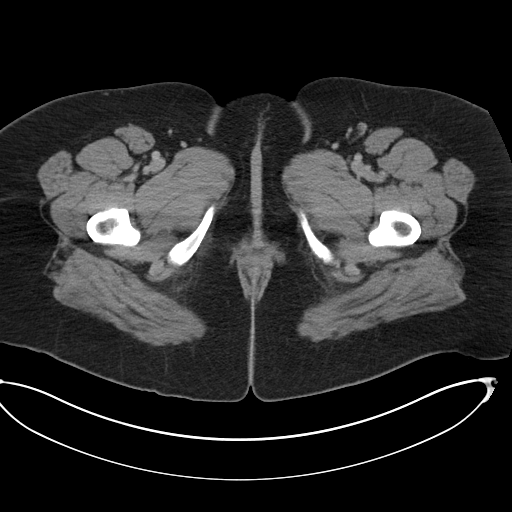
[im 5/107  bone]
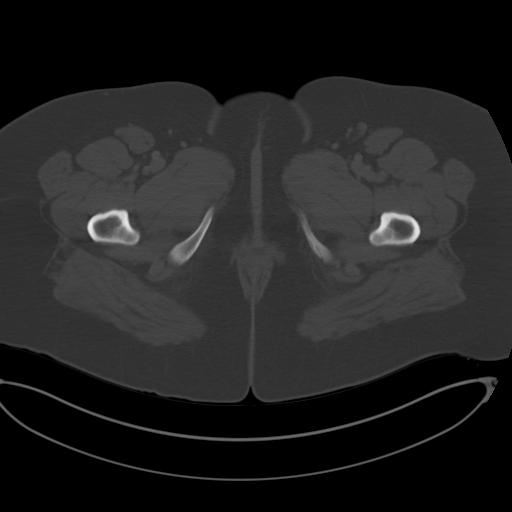
[im 13/107  soft-tissue]
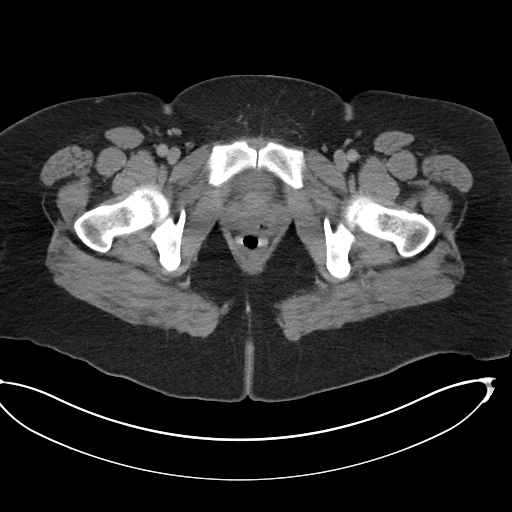
[im 22/107  soft-tissue]
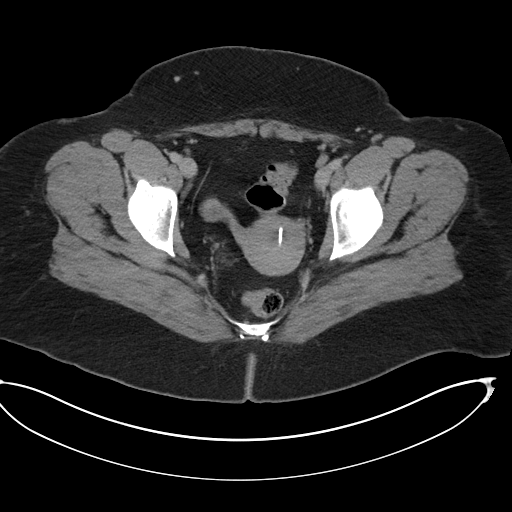
[im 30/107  soft-tissue]
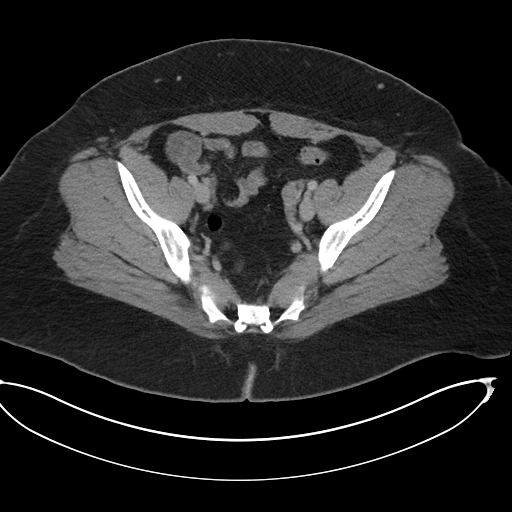
[im 39/107  soft-tissue]
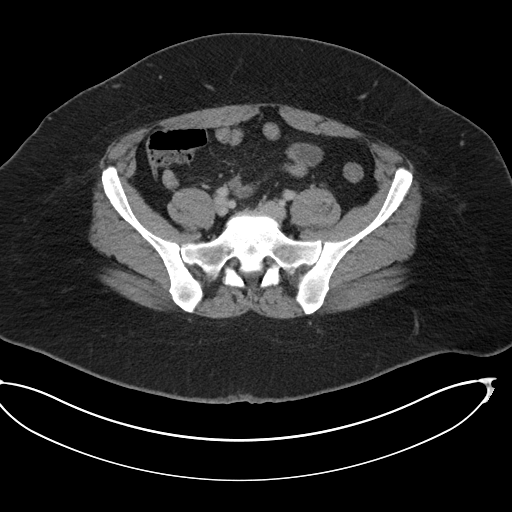
[im 47/107  soft-tissue]
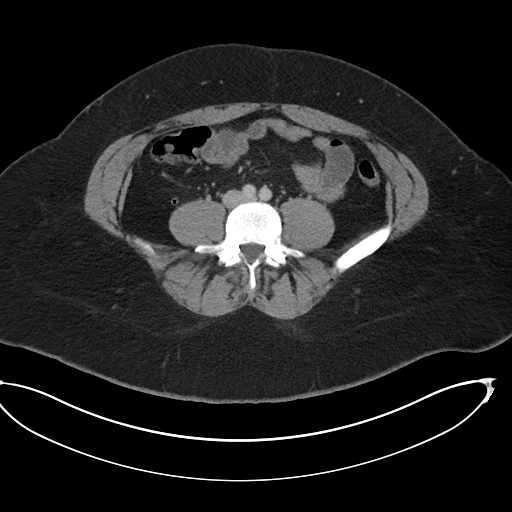
[im 56/107  soft-tissue]
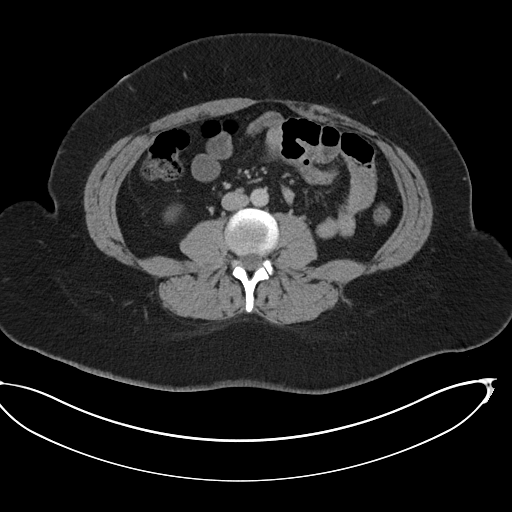
[im 60/107  soft-tissue]
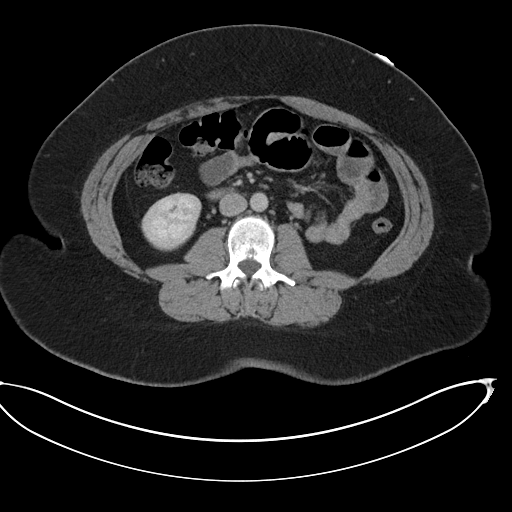
[im 68/107  soft-tissue]
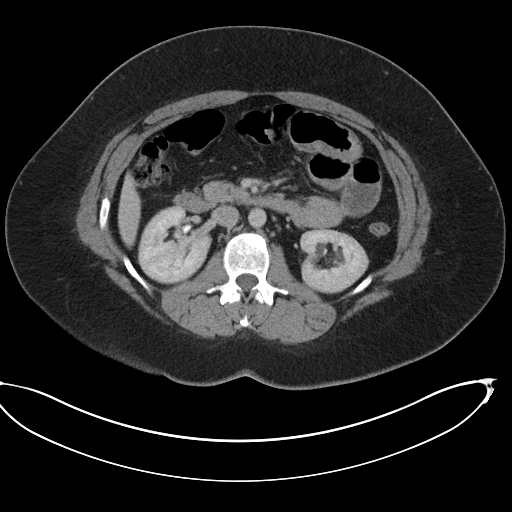
[im 68/107  bone]
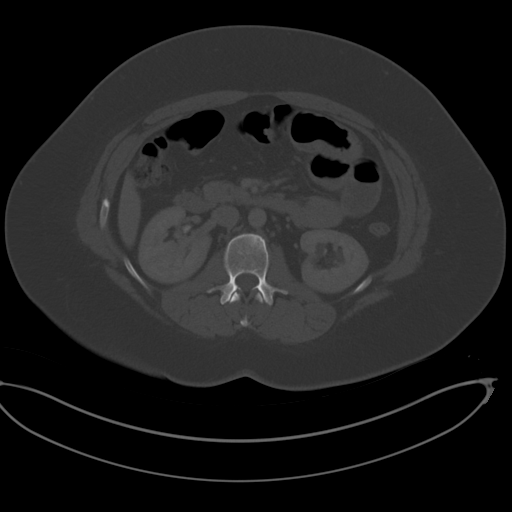
[im 77/107  soft-tissue]
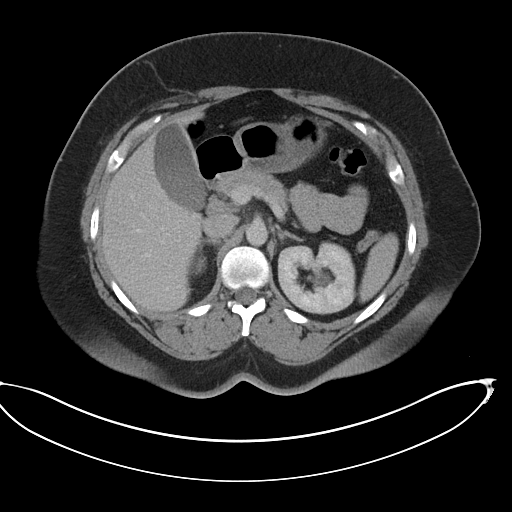
[im 85/107  soft-tissue]
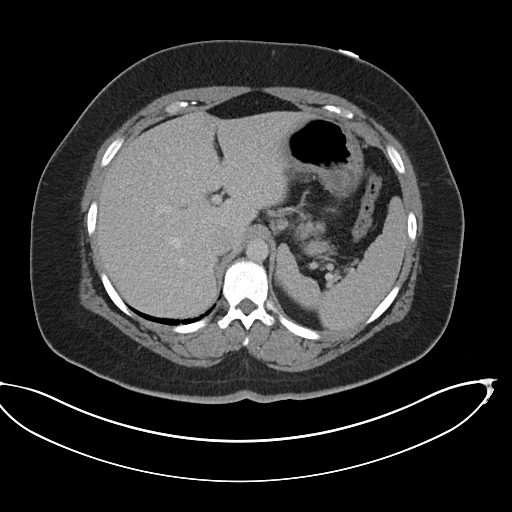
[im 94/107  soft-tissue]
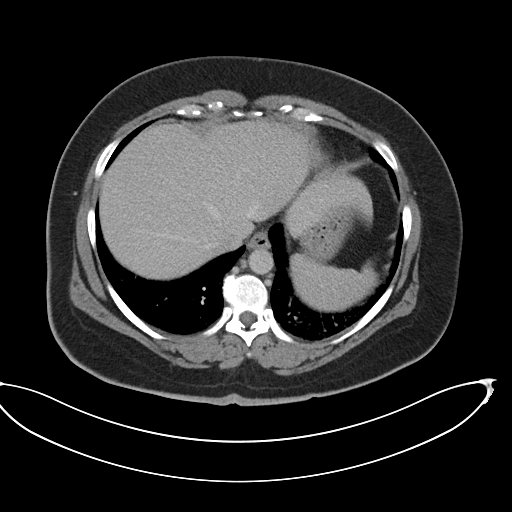
[im 102/107  soft-tissue]
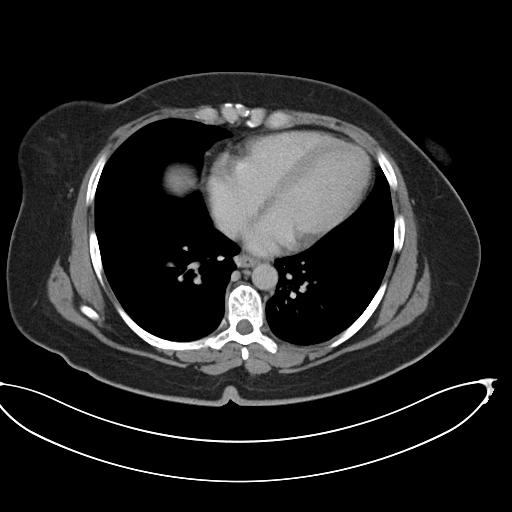

[Series 5: coronal soft tissue · coronal · 0.86mm/px · 3 of 86 slices shown]
[im 29/86  soft-tissue]
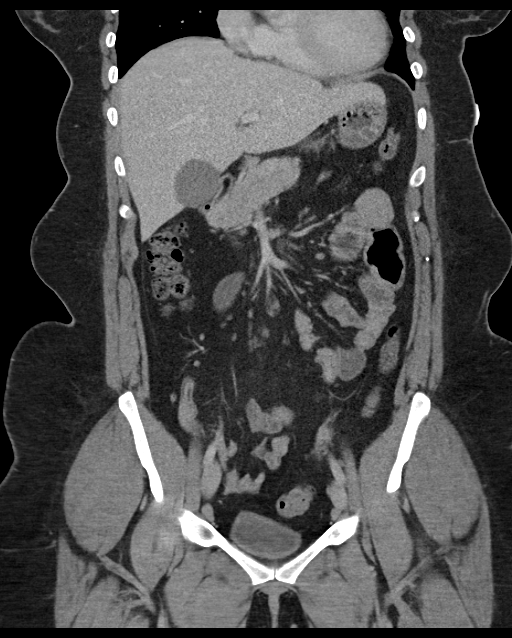
[im 38/86  soft-tissue]
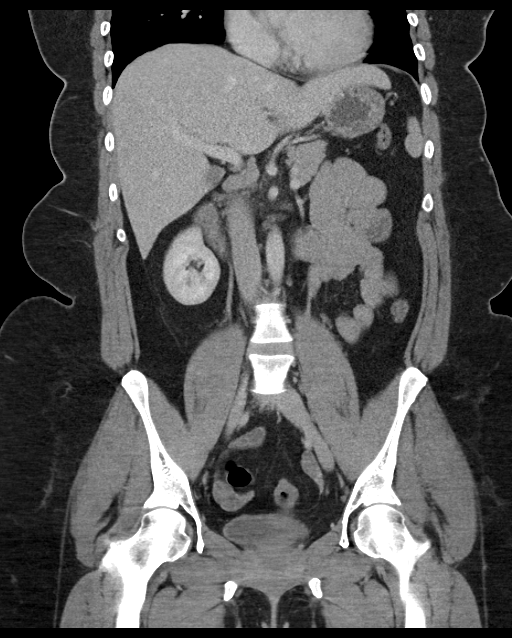
[im 48/86  soft-tissue]
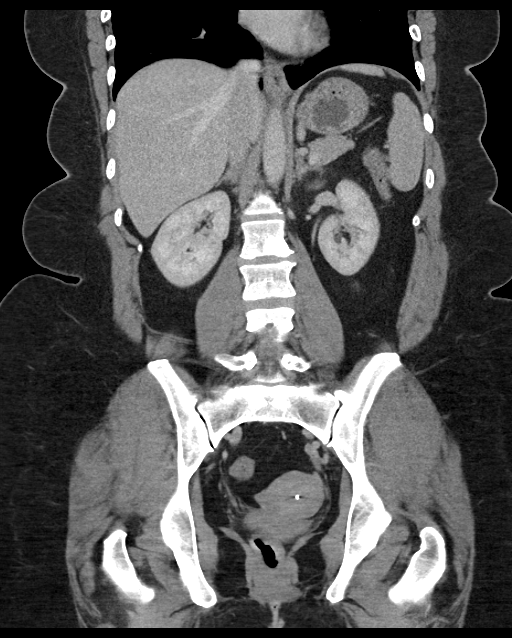

[16 of 46 positions shown; findings below may reference images not displayed]

FINDINGS: Lower chest: Small periesophageal nodes are favored to be reactive.
Clear lung bases. Normal heart size without pericardial or pleural
effusion.

Hepatobiliary: Normal liver. Normal gallbladder, without biliary
ductal dilatation.

Pancreas: Normal, without mass or ductal dilatation.

Spleen: Normal in size, without focal abnormality.

Adrenals/Urinary Tract: Normal adrenal glands. Left renal sinus
cysts. Normal right kidney, without hydronephrosis or hydroureter.
Normal urinary bladder.

Stomach/Bowel: Apparent gastric body wall thickening, mild,
including on image 29/series 2. Normal colon, appendix, and terminal
ileum. Normal small bowel.

Vascular/Lymphatic: Normal caliber of the aorta and branch vessels.
No abdominopelvic adenopathy.

Reproductive: Intrauterine device. Retroverted uterus. No adnexal
mass.

Other: No significant free fluid.

Musculoskeletal: Degenerative disc disease, including at the
lumbosacral junction.
IMPRESSION: 1. Apparent gastric body wall thickening, mild. Cannot exclude
gastritis.
2. Otherwise, no explanation for abdominal pain.

## 2017-10-13 IMAGING — DX DG CHEST 2V
2 series · 2 of 2 positions shown · non-contrast
Comparison: None

CLINICAL DATA: Vomiting for 4 days, bruised breast for 3 days, LEFT
upper abdominal pain for 2 days, coughing up mucus today

EXAM:
CHEST  2 VIEW

[w chest pa]
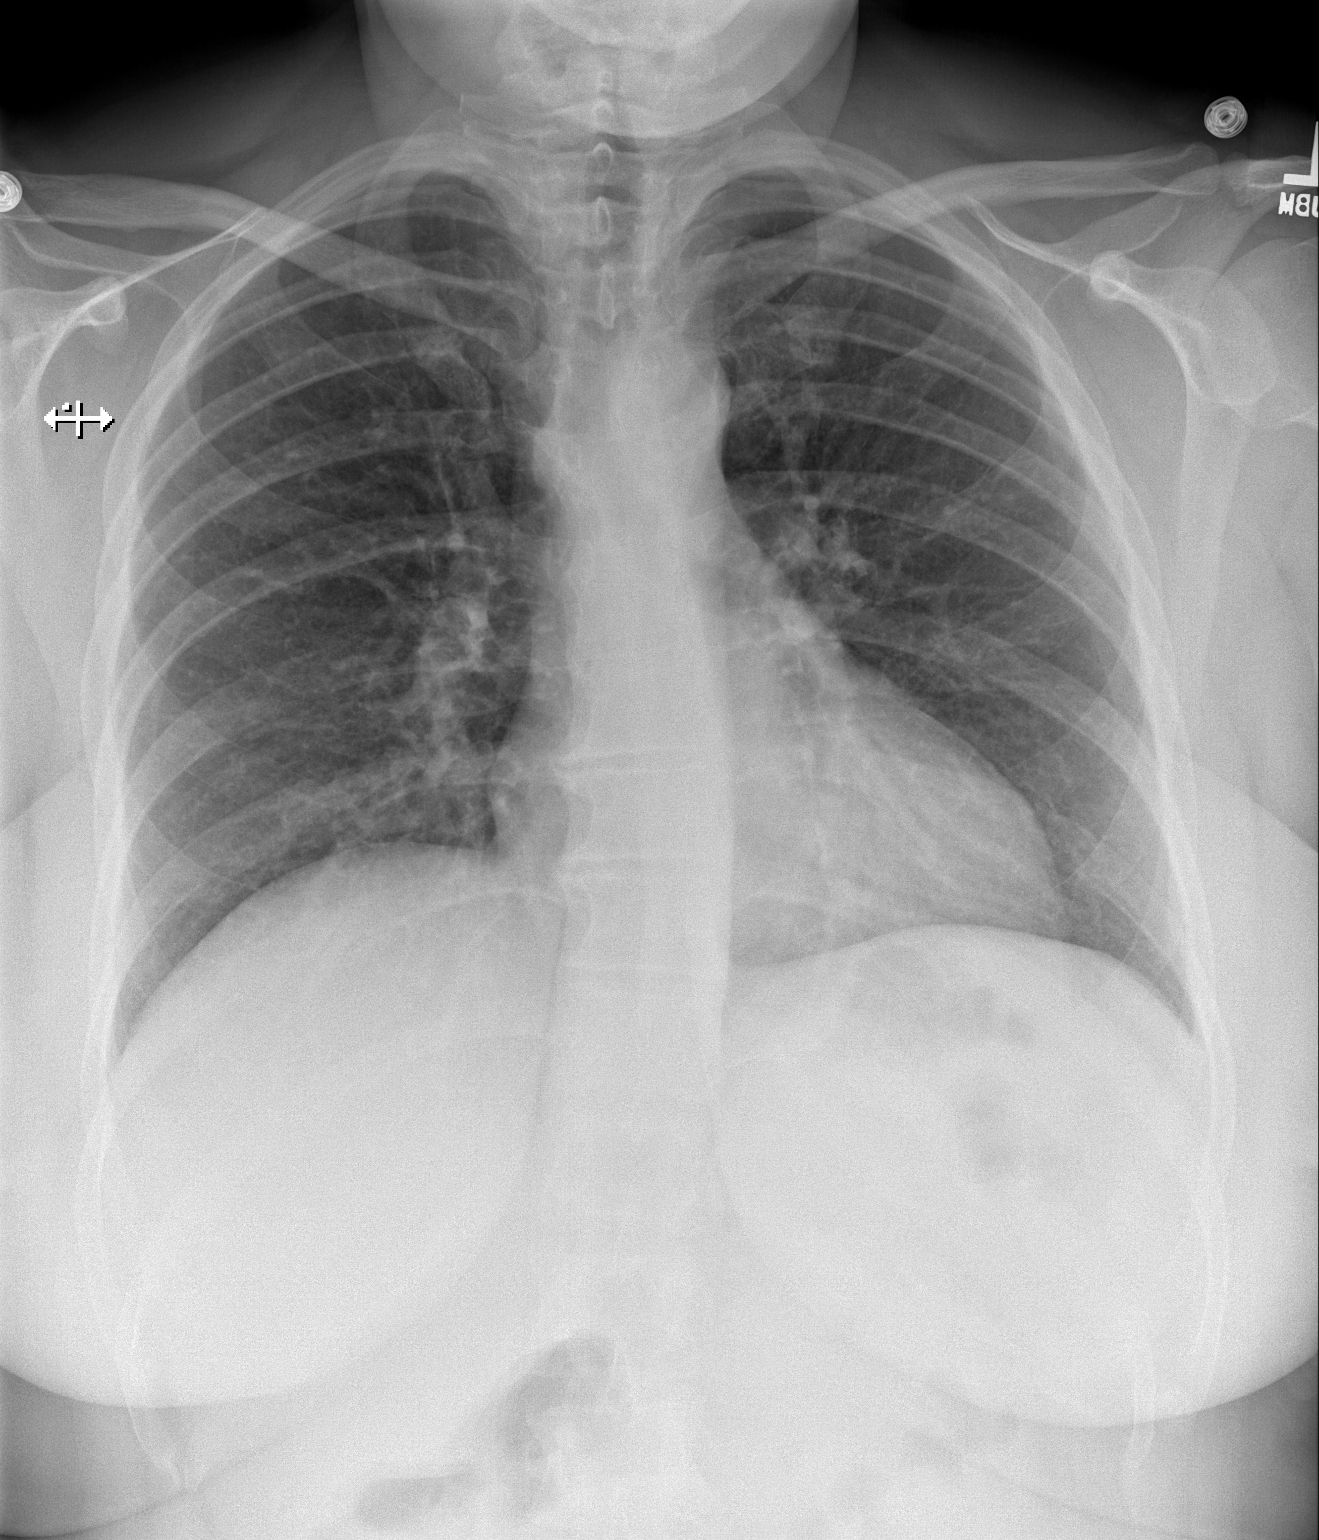

[w chest lat]
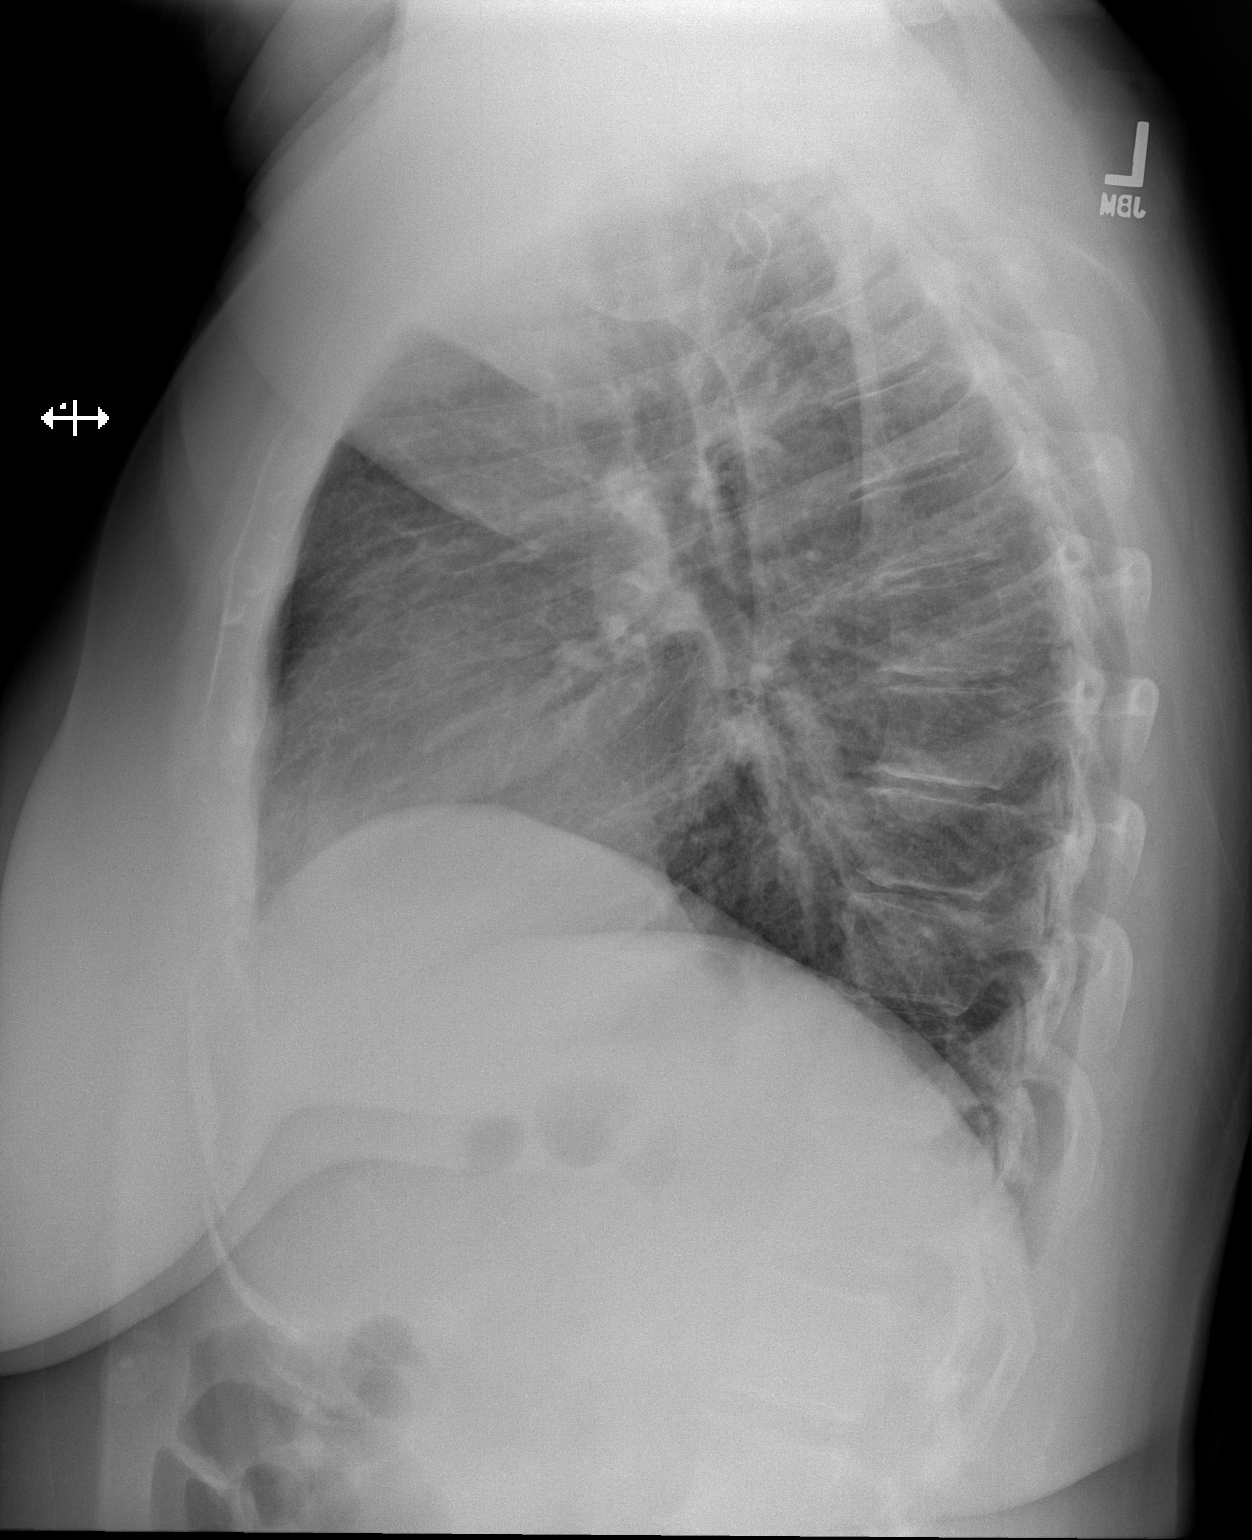

[2 of 2 positions shown; findings below may reference images not displayed]

FINDINGS: Normal heart size, mediastinal contours, and pulmonary vascularity.

Lungs clear.

No pleural effusion or pneumothorax.

Bones unremarkable.
IMPRESSION: No acute abnormalities.

## 2019-03-14 ENCOUNTER — Telehealth: Payer: Self-pay

## 2019-03-14 ENCOUNTER — Encounter: Payer: Self-pay | Admitting: Family Medicine

## 2019-03-14 ENCOUNTER — Other Ambulatory Visit: Payer: Self-pay

## 2019-03-14 ENCOUNTER — Ambulatory Visit (INDEPENDENT_AMBULATORY_CARE_PROVIDER_SITE_OTHER): Payer: BC Managed Care – PPO | Admitting: Family Medicine

## 2019-03-14 DIAGNOSIS — M545 Low back pain, unspecified: Secondary | ICD-10-CM

## 2019-03-14 DIAGNOSIS — G8929 Other chronic pain: Secondary | ICD-10-CM | POA: Diagnosis not present

## 2019-03-14 DIAGNOSIS — M79672 Pain in left foot: Secondary | ICD-10-CM | POA: Diagnosis not present

## 2019-03-14 MED ORDER — CELECOXIB 200 MG PO CAPS: 200.0000 mg | ORAL_CAPSULE | Freq: Two times a day (BID) | ORAL | 6 refills | Status: DC | PRN

## 2019-03-14 MED ORDER — CYCLOBENZAPRINE HCL 10 MG PO TABS: 10.0000 mg | ORAL_TABLET | Freq: Three times a day (TID) | ORAL | 3 refills | Status: DC | PRN

## 2019-03-14 MED ORDER — HYDROCODONE-ACETAMINOPHEN 5-325 MG PO TABS: 0.5000 | ORAL_TABLET | Freq: Every evening | ORAL | 0 refills | Status: DC | PRN

## 2019-03-14 NOTE — Telephone Encounter (Signed)
Please advise, pharmacy called and wanted Dx for patient's medication prescribed today and would like a call back. I informed her of the Dx and she needed more information since this was the patient's first time getting this narcotic.[facility] [phone]

## 2019-03-14 NOTE — Telephone Encounter (Signed)
Patient's pharmacy was called nad informed, Rx was refill with original Rx written. Pharmacist states they have taken care of it.

## 2019-03-14 NOTE — Telephone Encounter (Signed)
Error recorded wrong number for [facility].

## 2019-03-14 NOTE — Progress Notes (Signed)
Office Visit Note   Patient: Jamie Oconnor           Date of Birth: 18-Feb-1981           MRN: [id number] Visit Date: 03/14/2019 Requested by: No referring provider defined for this encounter. PCP: Patient, No Pcp Per  Subjective: Chief Complaint  Patient presents with  . Lower Back - Pain    Pain for years in lower back (R>L). Pain radiates into right hip. Working in an assisted living. Left heel pain for 3-4 years. NKI. Pain in middle of heel. +Ibuprofen.   . Left Foot - Pain    LEFT HEEL PAIN    HPI: She is a former patient of mine who presents with right-sided low back pain and left heel pain.  I originally saw her when she was about 38 years old, at another clinic.  At that time she she reports having tailbone pain.  I do not have those old records for review.  She managed her pain for a while with Flexeril and occasional hydrocodone but eventually initially her pain became managed visually her pain became manageable without medication.  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  She has started to develop right-sided low back pain with radiation into the posterior hip, worse with flexion.  Pain does not radiate down the leg and she has no bowel or bladder dysfunction.  No weakness or numbness in her legs.  Also in the past few years she has developed right plantar heel pain.  It is worse after being on her feet all day, sometimes hurts when she first starts walking.  Rolling her foot on a frozen water bottle helps.  Arch supports in her shoes have helped.               ROS: No fevers or chills.  All other systems were reviewed and are negative.  Objective: Vital Signs: There were no vitals taken for this visit.  Physical Exam:  General:  Alert and oriented, in no acute distress. Pulm:  Breathing unlabored. Psy:  Normal mood, congruent affect. Skin: No  rash or erythema. Low back: Good flexibility with forward flexion.  Stork test is negative.  Moderately tender to the right of midline at about L4-5 in the paraspinous muscles.  Also tender in the right sacroiliac joint.  No pain in sciatic notch, negative straight leg raise, and lower extremity strength and reflexes are normal. Left heel: She has point tenderness at the medial origin of the plantar fascia.  Moderate pes planus which is flexible.  She has good hamstring flexibility but is tight in the heel cords and the arches.  No pain with medial/lateral calcaneal squeeze.  Imaging: None today  Assessment & Plan: 1.  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  Cannot rule out disc protrusion.  Neurologic exam is nonfocal. -Physical therapy at [facility] rehab. -Flexeril as needed, hydrocodone only for severe pain.  Celebrex for inflammation. -Home stretching regimen given. -X-rays and MRI scan if symptoms persist.  2.  Left plantar fasciitis -Physical therapy, home stretches. -X-rays of heel and possibly ultrasound imaging and injection if symptoms persist.     Procedures: No procedures performed  No notes on file     PMFS History: There are no problems to display for this patient.  History reviewed. No pertinent past  medical history.  History reviewed. No pertinent family history.  Past Surgical History:  Procedure Laterality Date  . CESAREAN SECTION     Social History   Occupational History  . Not on file  Tobacco Use  . Smoking status: Never Smoker  . Smokeless tobacco: Never Used  Substance and Sexual Activity  . Alcohol use: Yes    Comment: rare  . Drug use: No  . Sexual activity: Yes    Birth control/protection: I.U.D.

## 2019-03-14 NOTE — Telephone Encounter (Signed)
Diagnosis is back pain.  If quantity is too much, we can change it to 10 tablets.

## 2019-05-22 ENCOUNTER — Telehealth: Payer: Self-pay | Admitting: Family Medicine

## 2019-05-22 MED ORDER — HYDROCODONE-ACETAMINOPHEN 5-325 MG PO TABS: 0.5000 | ORAL_TABLET | Freq: Every evening | ORAL | 0 refills | Status: DC | PRN

## 2019-05-22 NOTE — Telephone Encounter (Signed)
Sent!

## 2019-05-22 NOTE — Telephone Encounter (Signed)
I tried to call the patient to advise her - reached voice mail and it was full. She can check with the pharmacy.

## 2019-05-22 NOTE — Telephone Encounter (Signed)
Please advise 

## 2019-05-22 NOTE — Telephone Encounter (Signed)
Patient called. She would like a refill on hydrocodone. She has moved. She would like the rx called in to [facility] in [place] on [address].

## 2019-06-20 ENCOUNTER — Telehealth: Payer: Self-pay | Admitting: Family Medicine

## 2019-06-20 MED ORDER — HYDROCODONE-ACETAMINOPHEN 5-325 MG PO TABS: 0.5000 | ORAL_TABLET | Freq: Every evening | ORAL | 0 refills | Status: DC | PRN

## 2019-06-20 NOTE — Telephone Encounter (Signed)
Pt informed

## 2019-06-20 NOTE — Telephone Encounter (Signed)
Sent!

## 2019-06-20 NOTE — Telephone Encounter (Signed)
Patient called needing Rx refilled Hydrocodone. The number to contact patient patient is [phone]

## 2019-06-20 NOTE — Telephone Encounter (Signed)
Please advise 

## 2019-08-21 ENCOUNTER — Telehealth: Payer: Self-pay | Admitting: Family Medicine

## 2019-08-21 MED ORDER — HYDROCODONE-ACETAMINOPHEN 5-325 MG PO TABS: 0.5000 | ORAL_TABLET | Freq: Every evening | ORAL | 0 refills | Status: DC | PRN

## 2019-08-21 NOTE — Telephone Encounter (Signed)
Patient called to request an RX refill on the Hydrocodone.  Patient has changed insurance and they will only cover a 7 day supply.  CB#[phone].  Thank you.

## 2019-08-21 NOTE — Telephone Encounter (Signed)
Please advise 

## 2019-08-21 NOTE — Telephone Encounter (Signed)
New Rx sent.

## 2019-08-21 NOTE — Telephone Encounter (Signed)
I called and advised the patient her medication was sent in to her pharmacy.

## 2019-09-06 ENCOUNTER — Telehealth: Payer: Self-pay | Admitting: Family Medicine

## 2019-09-06 DIAGNOSIS — M545 Low back pain, unspecified: Secondary | ICD-10-CM

## 2019-09-06 NOTE — Telephone Encounter (Signed)
Please advise 

## 2019-09-06 NOTE — Telephone Encounter (Signed)
Patient called requesting an RX refill on her Hydrocodone.  [facility] uses [facility] on [address]  She also stated that the last RX was for a 7 day supply due to her changing insurance.  CB#[phone].  Thank you.

## 2019-09-07 MED ORDER — HYDROCODONE-ACETAMINOPHEN 5-325 MG PO TABS: 0.5000 | ORAL_TABLET | Freq: Every evening | ORAL | 0 refills | Status: DC | PRN

## 2019-09-07 NOTE — Telephone Encounter (Signed)
Patient is still having chronic pain requiring narcotics despite doing physical therapy.  I will order x-rays and MRI scan lumbar spine to further evaluate.

## 2019-09-07 NOTE — Telephone Encounter (Signed)
Rx sent, but I am also ordering x-rays and MRI to be done at [facility] since it's still hurting this much.

## 2019-09-07 NOTE — Telephone Encounter (Signed)
LVM for pt to call back to inform 

## 2019-09-18 NOTE — Telephone Encounter (Signed)
FYI. Pt never returned call

## 2019-10-17 ENCOUNTER — Encounter: Payer: Self-pay | Admitting: Radiology

## 2019-12-27 ENCOUNTER — Encounter (HOSPITAL_COMMUNITY): Payer: Self-pay

## 2019-12-28 ENCOUNTER — Telehealth (HOSPITAL_COMMUNITY): Payer: Self-pay | Admitting: *Deleted

## 2019-12-28 ENCOUNTER — Encounter (HOSPITAL_COMMUNITY): Payer: Self-pay | Admitting: *Deleted

## 2019-12-28 NOTE — Telephone Encounter (Signed)
Preadmission screen  

## 2019-12-28 NOTE — Patient Instructions (Addendum)
Nakyra Bourn  12/28/2019   Your procedure is scheduled on:  01/04/2020  Arrive at 0800 at [employer] [facility] on [address] at [facility]  and [employer]. You are invited to use the FREE valet parking or use the Visitor's parking deck.  Pick up the phone at the desk and dial [phone].  Call this number if you have problems the morning of surgery: [phone]  Remember:   Do not eat food:(After Midnight) Desps de medianoche.  Do not drink clear liquids: (After Midnight) Desps de medianoche.  Take these medicines the morning of surgery with A SIP OF WATER:  none   Do not wear jewelry, make-up or nail polish.  Do not wear lotions, powders, or perfumes. Do not wear deodorant.  Do not shave 48 hours prior to surgery.  Do not bring valuables to the hospital.  [facility] is not   responsible for any belongings or valuables brought to the hospital.  Contacts, dentures or bridgework may not be worn into surgery.  Leave suitcase in the car. After surgery it may be brought to your room.  For patients admitted to the hospital, checkout time is 11:00 AM the day of              discharge.      Please read over the following fact sheets that you were given:     Preparing for Surgery

## 2019-12-31 ENCOUNTER — Encounter (HOSPITAL_COMMUNITY): Payer: Self-pay

## 2019-12-31 ENCOUNTER — Telehealth (HOSPITAL_COMMUNITY): Payer: Self-pay | Admitting: *Deleted

## 2019-12-31 NOTE — Telephone Encounter (Signed)
Preadmission screen  

## 2020-01-01 ENCOUNTER — Other Ambulatory Visit (HOSPITAL_COMMUNITY)
Admission: RE | Admit: 2020-01-01 | Discharge: 2020-01-01 | Disposition: A | Discharge status: Home / Self Care | Payer: 59 | Source: Ambulatory Visit | Attending: Obstetrics and Gynecology | Admitting: Obstetrics and Gynecology

## 2020-01-01 ENCOUNTER — Other Ambulatory Visit: Payer: Self-pay

## 2020-01-01 DIAGNOSIS — Z01812 Encounter for preprocedural laboratory examination: Secondary | ICD-10-CM | POA: Insufficient documentation

## 2020-01-01 DIAGNOSIS — Z20822 Contact with and (suspected) exposure to covid-19: Secondary | ICD-10-CM | POA: Insufficient documentation

## 2020-01-01 LAB — CBC
HCT: 36.2 % (ref 36.0–46.0)
Hemoglobin: 12.2 g/dL (ref 12.0–15.0)
MCH: 30.2 pg (ref 26.0–34.0)
MCHC: 33.7 g/dL (ref 30.0–36.0)
MCV: 89.6 fL (ref 80.0–100.0)
Platelets: 242 10*3/uL (ref 150–400)
RBC: 4.04 MIL/uL (ref 3.87–5.11)
RDW: 14 % (ref 11.5–15.5)
WBC: 8.8 10*3/uL (ref 4.0–10.5)
nRBC: 0 % (ref 0.0–0.2)

## 2020-01-01 LAB — TYPE AND SCREEN
ABO/RH(D): A POS
Antibody Screen: NEGATIVE

## 2020-01-01 LAB — SARS CORONAVIRUS 2 (TAT 6-24 HRS): SARS Coronavirus 2: NEGATIVE

## 2020-01-01 NOTE — Progress Notes (Signed)
Presents for Covid swab and pre-op blood work.  Denies Covid, swab obtained and tolerated well.  Pre op instructions and body scrub given.

## 2020-01-01 NOTE — Patient Instructions (Signed)
[name]  01/01/2020   Your procedure is scheduled on:  10.8.2021  Arrive at 0800 at Entr[facility] C on [address] at [facility]  and [employer]. You are invited to use the FREE valet parking or use the Visitor's parking deck.  Pick up the phone at the desk and dial [phone].  Call this number if you have problems the morning of surgery: [phone]  Remember:   Do not eat food:(After Midnight) Desps de medianoche.  Do not drink clear liquids: (After Midnight) Desps de medianoche.  Take these medicines the morning of surgery with A SIP OF WATER:  none   Do not wear jewelry, make-up or nail polish.  Do not wear lotions, powders, or perfumes. Do not wear deodorant.  Do not shave 48 hours prior to surgery.  Do not bring valuables to the hospital.  [facility] is not   responsible for any belongings or valuables brought to the hospital.  Contacts, dentures or bridgework may not be worn into surgery.  Leave suitcase in the car. After surgery it may be brought to your room.  For patients admitted to the hospital, checkout time is 11:00 AM the day of              discharge.      Please read over the following fact sheets that you were given:     Preparing for Surgery

## 2020-01-02 ENCOUNTER — Other Ambulatory Visit (HOSPITAL_COMMUNITY)
Admission: RE | Admit: 2020-01-02 | Discharge: 2020-01-02 | Disposition: A | Discharge status: Home / Self Care | Payer: 59 | Source: Ambulatory Visit | Attending: Obstetrics and Gynecology | Admitting: Obstetrics and Gynecology

## 2020-01-02 HISTORY — DX: Depression, unspecified: F32.A

## 2020-01-02 LAB — RPR
RPR Ser Ql: REACTIVE — AB
RPR Titer: 1:1 {titer}

## 2020-01-03 LAB — T.PALLIDUM AB, TOTAL: T Pallidum Abs: NONREACTIVE

## 2020-01-04 ENCOUNTER — Other Ambulatory Visit: Payer: Self-pay

## 2020-01-04 ENCOUNTER — Encounter (HOSPITAL_COMMUNITY)
Admission: RE | Disposition: A | Discharge status: Home / Self Care | Payer: Self-pay | Source: Home / Self Care | Attending: Obstetrics and Gynecology

## 2020-01-04 ENCOUNTER — Inpatient Hospital Stay (HOSPITAL_COMMUNITY): Payer: 59 | Admitting: Certified Registered Nurse Anesthetist

## 2020-01-04 ENCOUNTER — Inpatient Hospital Stay (HOSPITAL_COMMUNITY)
Admission: RE | Admit: 2020-01-04 | Discharge: 2020-01-06 | DRG: 785 | Disposition: A | Discharge status: Home / Self Care | Payer: 59 | Attending: Obstetrics and Gynecology | Admitting: Obstetrics and Gynecology

## 2020-01-04 ENCOUNTER — Encounter (HOSPITAL_COMMUNITY): Payer: Self-pay | Admitting: Obstetrics and Gynecology

## 2020-01-04 DIAGNOSIS — Z01812 Encounter for preprocedural laboratory examination: Secondary | ICD-10-CM

## 2020-01-04 DIAGNOSIS — Z3A39 39 weeks gestation of pregnancy: Secondary | ICD-10-CM

## 2020-01-04 DIAGNOSIS — Z302 Encounter for sterilization: Secondary | ICD-10-CM | POA: Diagnosis not present

## 2020-01-04 DIAGNOSIS — O34211 Maternal care for low transverse scar from previous cesarean delivery: Principal | ICD-10-CM | POA: Diagnosis present

## 2020-01-04 DIAGNOSIS — Z20822 Contact with and (suspected) exposure to covid-19: Secondary | ICD-10-CM | POA: Diagnosis present

## 2020-01-04 DIAGNOSIS — Z98891 History of uterine scar from previous surgery: Secondary | ICD-10-CM

## 2020-01-04 DIAGNOSIS — O99214 Obesity complicating childbirth: Secondary | ICD-10-CM | POA: Diagnosis present

## 2020-01-04 SURGERY — Surgical Case
Anesthesia: Spinal | Site: Abdomen | Laterality: Bilateral | Wound class: Clean Contaminated

## 2020-01-04 MED ORDER — DEXAMETHASONE SODIUM PHOSPHATE 4 MG/ML IJ SOLN
INTRAMUSCULAR | Status: DC | PRN
  Administered 2020-01-04: 4 mg via INTRAVENOUS

## 2020-01-04 MED ORDER — DIPHENHYDRAMINE HCL 25 MG PO CAPS: 25.0000 mg | ORAL_CAPSULE | ORAL | Status: DC | PRN

## 2020-01-04 MED ORDER — ACETAMINOPHEN 325 MG PO TABS
650.0000 mg | ORAL_TABLET | ORAL | Status: DC | PRN
  Administered 2020-01-05 – 2020-01-06 (×3): 650 mg via ORAL
  Filled 2020-01-04 (×3): qty 2

## 2020-01-04 MED ORDER — POVIDONE-IODINE 10 % EX SWAB
2.0000 "application " | Freq: Once | CUTANEOUS | Status: AC
  Administered 2020-01-04: 2 via TOPICAL

## 2020-01-04 MED ORDER — SIMETHICONE 80 MG PO CHEW
80.0000 mg | CHEWABLE_TABLET | ORAL | Status: DC
  Administered 2020-01-04 – 2020-01-05 (×2): 80 mg via ORAL
  Filled 2020-01-04 (×3): qty 1

## 2020-01-04 MED ORDER — ONDANSETRON HCL 4 MG/2ML IJ SOLN
INTRAMUSCULAR | Status: AC
  Filled 2020-01-04: qty 2

## 2020-01-04 MED ORDER — SIMETHICONE 80 MG PO CHEW
80.0000 mg | CHEWABLE_TABLET | ORAL | Status: DC | PRN
  Filled 2020-01-04: qty 1

## 2020-01-04 MED ORDER — MENTHOL 3 MG MT LOZG: 1.0000 | LOZENGE | OROMUCOSAL | Status: DC | PRN

## 2020-01-04 MED ORDER — KETOROLAC TROMETHAMINE 30 MG/ML IJ SOLN: 30.0000 mg | Freq: Four times a day (QID) | INTRAMUSCULAR | Status: AC | PRN

## 2020-01-04 MED ORDER — SODIUM CHLORIDE 0.9 % IV SOLN: INTRAVENOUS | Status: DC | PRN

## 2020-01-04 MED ORDER — NALBUPHINE HCL 10 MG/ML IJ SOLN: 5.0000 mg | INTRAMUSCULAR | Status: DC | PRN

## 2020-01-04 MED ORDER — MEPERIDINE HCL 25 MG/ML IJ SOLN: 6.2500 mg | INTRAMUSCULAR | Status: DC | PRN

## 2020-01-04 MED ORDER — SCOPOLAMINE 1 MG/3DAYS TD PT72
MEDICATED_PATCH | TRANSDERMAL | Status: AC
  Filled 2020-01-04: qty 1

## 2020-01-04 MED ORDER — SODIUM CHLORIDE 0.9% FLUSH: 3.0000 mL | INTRAVENOUS | Status: DC | PRN

## 2020-01-04 MED ORDER — HYDROMORPHONE HCL 1 MG/ML IJ SOLN: 0.2500 mg | INTRAMUSCULAR | Status: DC | PRN

## 2020-01-04 MED ORDER — DIPHENHYDRAMINE HCL 50 MG/ML IJ SOLN
12.5000 mg | INTRAMUSCULAR | Status: DC | PRN
  Administered 2020-01-04: 12.5 mg via INTRAVENOUS

## 2020-01-04 MED ORDER — FENTANYL CITRATE (PF) 100 MCG/2ML IJ SOLN
INTRAMUSCULAR | Status: DC | PRN
Start: 2020-01-04 — End: 2020-01-04
  Administered 2020-01-04: 15 ug via INTRATHECAL

## 2020-01-04 MED ORDER — MORPHINE SULFATE (PF) 0.5 MG/ML IJ SOLN
INTRAMUSCULAR | Status: AC
  Filled 2020-01-04: qty 10

## 2020-01-04 MED ORDER — DEXAMETHASONE SODIUM PHOSPHATE 4 MG/ML IJ SOLN
INTRAMUSCULAR | Status: AC
  Filled 2020-01-04: qty 1

## 2020-01-04 MED ORDER — OXYTOCIN-SODIUM CHLORIDE 30-0.9 UT/500ML-% IV SOLN
INTRAVENOUS | Status: DC | PRN
  Administered 2020-01-04: 30 [IU] via INTRAVENOUS

## 2020-01-04 MED ORDER — SIMETHICONE 80 MG PO CHEW
80.0000 mg | CHEWABLE_TABLET | Freq: Three times a day (TID) | ORAL | Status: DC
  Administered 2020-01-04 – 2020-01-05 (×4): 80 mg via ORAL
  Filled 2020-01-04 (×6): qty 1

## 2020-01-04 MED ORDER — SCOPOLAMINE 1 MG/3DAYS TD PT72
1.0000 | MEDICATED_PATCH | Freq: Once | TRANSDERMAL | Status: DC
  Administered 2020-01-04: 1.5 mg via TRANSDERMAL

## 2020-01-04 MED ORDER — OXYCODONE HCL 5 MG PO TABS
5.0000 mg | ORAL_TABLET | ORAL | Status: DC | PRN
  Administered 2020-01-05: 10 mg via ORAL
  Filled 2020-01-04: qty 2

## 2020-01-04 MED ORDER — MORPHINE SULFATE (PF) 0.5 MG/ML IJ SOLN
INTRAMUSCULAR | Status: DC | PRN
Start: 2020-01-04 — End: 2020-01-04
  Administered 2020-01-04: .15 mg via INTRATHECAL

## 2020-01-04 MED ORDER — BUPIVACAINE IN DEXTROSE 0.75-8.25 % IT SOLN
INTRATHECAL | Status: DC | PRN
  Administered 2020-01-04: 1.6 mL via INTRATHECAL

## 2020-01-04 MED ORDER — IBUPROFEN 600 MG PO TABS
600.0000 mg | ORAL_TABLET | Freq: Four times a day (QID) | ORAL | Status: DC | PRN
  Administered 2020-01-05 – 2020-01-06 (×4): 600 mg via ORAL
  Filled 2020-01-04 (×4): qty 1

## 2020-01-04 MED ORDER — DIPHENHYDRAMINE HCL 50 MG/ML IJ SOLN
INTRAMUSCULAR | Status: AC
  Filled 2020-01-04: qty 1

## 2020-01-04 MED ORDER — LACTATED RINGERS IV SOLN: INTRAVENOUS | Status: DC

## 2020-01-04 MED ORDER — ACETAMINOPHEN 500 MG PO TABS
1000.0000 mg | ORAL_TABLET | Freq: Four times a day (QID) | ORAL | Status: AC
  Administered 2020-01-04 – 2020-01-05 (×3): 1000 mg via ORAL
  Filled 2020-01-04 (×3): qty 2

## 2020-01-04 MED ORDER — SOD CITRATE-CITRIC ACID 500-334 MG/5ML PO SOLN
ORAL | Status: AC
  Filled 2020-01-04: qty 30

## 2020-01-04 MED ORDER — DEXTROSE 5 % IV SOLN
INTRAVENOUS | Status: AC
  Filled 2020-01-04: qty 3000

## 2020-01-04 MED ORDER — NALBUPHINE HCL 10 MG/ML IJ SOLN
5.0000 mg | Freq: Once | INTRAMUSCULAR | Status: AC | PRN
  Administered 2020-01-04: 5 mg via INTRAVENOUS

## 2020-01-04 MED ORDER — KETOROLAC TROMETHAMINE 30 MG/ML IJ SOLN
30.0000 mg | Freq: Once | INTRAMUSCULAR | Status: AC | PRN
  Administered 2020-01-04: 30 mg via INTRAVENOUS

## 2020-01-04 MED ORDER — NALOXONE HCL 4 MG/10ML IJ SOLN
1.0000 ug/kg/h | INTRAVENOUS | Status: DC | PRN
  Filled 2020-01-04: qty 5

## 2020-01-04 MED ORDER — STERILE WATER FOR IRRIGATION IR SOLN
Status: DC | PRN
  Administered 2020-01-04: 1000 mL

## 2020-01-04 MED ORDER — OXYTOCIN-SODIUM CHLORIDE 30-0.9 UT/500ML-% IV SOLN
INTRAVENOUS | Status: AC
  Filled 2020-01-04: qty 500

## 2020-01-04 MED ORDER — FENTANYL CITRATE (PF) 100 MCG/2ML IJ SOLN
INTRAMUSCULAR | Status: AC
  Filled 2020-01-04: qty 2

## 2020-01-04 MED ORDER — PRENATAL MULTIVITAMIN CH
1.0000 | ORAL_TABLET | Freq: Every day | ORAL | Status: DC
  Administered 2020-01-05: 1 via ORAL
  Filled 2020-01-04: qty 1

## 2020-01-04 MED ORDER — NALBUPHINE HCL 10 MG/ML IJ SOLN
INTRAMUSCULAR | Status: AC
  Filled 2020-01-04: qty 1

## 2020-01-04 MED ORDER — TETANUS-DIPHTH-ACELL PERTUSSIS 5-2.5-18.5 LF-MCG/0.5 IM SUSP: 0.5000 mL | Freq: Once | INTRAMUSCULAR | Status: DC

## 2020-01-04 MED ORDER — SODIUM CHLORIDE 0.9 % IR SOLN
Status: DC | PRN
  Administered 2020-01-04: 1000 mL

## 2020-01-04 MED ORDER — DIPHENHYDRAMINE HCL 25 MG PO CAPS: 25.0000 mg | ORAL_CAPSULE | Freq: Four times a day (QID) | ORAL | Status: DC | PRN

## 2020-01-04 MED ORDER — NALOXONE HCL 0.4 MG/ML IJ SOLN: 0.4000 mg | INTRAMUSCULAR | Status: DC | PRN

## 2020-01-04 MED ORDER — WITCH HAZEL-GLYCERIN EX PADS: 1.0000 "application " | MEDICATED_PAD | CUTANEOUS | Status: DC | PRN

## 2020-01-04 MED ORDER — COCONUT OIL OIL: 1.0000 "application " | TOPICAL_OIL | Status: DC | PRN

## 2020-01-04 MED ORDER — PROMETHAZINE HCL 25 MG/ML IJ SOLN: 6.2500 mg | INTRAMUSCULAR | Status: DC | PRN

## 2020-01-04 MED ORDER — PHENYLEPHRINE HCL-NACL 20-0.9 MG/250ML-% IV SOLN
INTRAVENOUS | Status: DC | PRN
  Administered 2020-01-04: 60 ug/min via INTRAVENOUS

## 2020-01-04 MED ORDER — NALBUPHINE HCL 10 MG/ML IJ SOLN: 5.0000 mg | Freq: Once | INTRAMUSCULAR | Status: AC | PRN

## 2020-01-04 MED ORDER — HYDROMORPHONE HCL 1 MG/ML IJ SOLN: 0.2000 mg | INTRAMUSCULAR | Status: DC | PRN

## 2020-01-04 MED ORDER — DIBUCAINE (PERIANAL) 1 % EX OINT: 1.0000 "application " | TOPICAL_OINTMENT | CUTANEOUS | Status: DC | PRN

## 2020-01-04 MED ORDER — SOD CITRATE-CITRIC ACID 500-334 MG/5ML PO SOLN
30.0000 mL | ORAL | Status: AC
  Administered 2020-01-04: 30 mL via ORAL

## 2020-01-04 MED ORDER — ONDANSETRON HCL 4 MG/2ML IJ SOLN: 4.0000 mg | Freq: Three times a day (TID) | INTRAMUSCULAR | Status: DC | PRN

## 2020-01-04 MED ORDER — OXYTOCIN-SODIUM CHLORIDE 30-0.9 UT/500ML-% IV SOLN: 2.5000 [IU]/h | INTRAVENOUS | Status: AC

## 2020-01-04 MED ORDER — SENNOSIDES-DOCUSATE SODIUM 8.6-50 MG PO TABS
2.0000 | ORAL_TABLET | ORAL | Status: DC
  Administered 2020-01-04 – 2020-01-05 (×2): 2 via ORAL
  Filled 2020-01-04 (×3): qty 2

## 2020-01-04 MED ORDER — CEFAZOLIN SODIUM-DEXTROSE 2-4 GM/100ML-% IV SOLN
2.0000 g | INTRAVENOUS | Status: AC
  Administered 2020-01-04: 3 g via INTRAVENOUS

## 2020-01-04 MED ORDER — ONDANSETRON HCL 4 MG/2ML IJ SOLN
INTRAMUSCULAR | Status: DC | PRN
  Administered 2020-01-04: 4 mg via INTRAVENOUS

## 2020-01-04 MED ORDER — KETOROLAC TROMETHAMINE 30 MG/ML IJ SOLN
INTRAMUSCULAR | Status: AC
  Filled 2020-01-04: qty 1

## 2020-01-04 MED ORDER — ZOLPIDEM TARTRATE 5 MG PO TABS: 5.0000 mg | ORAL_TABLET | Freq: Every evening | ORAL | Status: DC | PRN

## 2020-01-04 SURGICAL SUPPLY — 41 items
ADH SKN CLS APL DERMABOND .7 (GAUZE/BANDAGES/DRESSINGS)
APL SKNCLS STERI-STRIP NONHPOA (GAUZE/BANDAGES/DRESSINGS) ×1
BENZOIN TINCTURE PRP APPL 2/3 (GAUZE/BANDAGES/DRESSINGS) ×2 IMPLANT
CLAMP CORD UMBIL (MISCELLANEOUS) IMPLANT
CLOSURE STERI STRIP 1/2 X4 (GAUZE/BANDAGES/DRESSINGS) ×2 IMPLANT
CLOSURE WOUND 1/2 X4 (GAUZE/BANDAGES/DRESSINGS)
CLOTH BEACON ORANGE TIMEOUT ST (SAFETY) ×3 IMPLANT
DERMABOND ADVANCED (GAUZE/BANDAGES/DRESSINGS)
DERMABOND ADVANCED .7 DNX12 (GAUZE/BANDAGES/DRESSINGS) IMPLANT
DRSG OPSITE POSTOP 4X10 (GAUZE/BANDAGES/DRESSINGS) ×3 IMPLANT
ELECT REM PT RETURN 9FT ADLT (ELECTROSURGICAL) ×3
ELECTRODE REM PT RTRN 9FT ADLT (ELECTROSURGICAL) ×1 IMPLANT
EXTRACTOR VACUUM M CUP 4 TUBE (SUCTIONS) IMPLANT
EXTRACTOR VACUUM M CUP 4' TUBE (SUCTIONS)
GAUZE SPONGE 4X4 12PLY STRL LF (GAUZE/BANDAGES/DRESSINGS) ×4 IMPLANT
GLOVE BIO SURGEON STRL SZ7.5 (GLOVE) ×3 IMPLANT
GLOVE BIOGEL PI IND STRL 7.0 (GLOVE) ×1 IMPLANT
GLOVE BIOGEL PI INDICATOR 7.0 (GLOVE) ×2
GOWN STRL REUS W/TWL LRG LVL3 (GOWN DISPOSABLE) ×6 IMPLANT
KIT ABG SYR 3ML LUER SLIP (SYRINGE) ×3 IMPLANT
NDL HYPO 25X5/8 SAFETYGLIDE (NEEDLE) ×1 IMPLANT
NEEDLE HYPO 22GX1.5 SAFETY (NEEDLE) ×3 IMPLANT
NEEDLE HYPO 25X5/8 SAFETYGLIDE (NEEDLE) ×3 IMPLANT
NS IRRIG 1000ML POUR BTL (IV SOLUTION) ×3 IMPLANT
PACK C SECTION WH (CUSTOM PROCEDURE TRAY) ×3 IMPLANT
PAD ABD 7.5X8 STRL (GAUZE/BANDAGES/DRESSINGS) ×2 IMPLANT
PAD OB MATERNITY 4.3X12.25 (PERSONAL CARE ITEMS) ×3 IMPLANT
PENCIL SMOKE EVAC W/HOLSTER (ELECTROSURGICAL) ×3 IMPLANT
SPONGE GAUZE 4X4 12PLY STER LF (GAUZE/BANDAGES/DRESSINGS) ×4 IMPLANT
STRIP CLOSURE SKIN 1/2X4 (GAUZE/BANDAGES/DRESSINGS) IMPLANT
SUT MNCRL 0 VIOLET CTX 36 (SUTURE) ×4 IMPLANT
SUT MONOCRYL 0 CTX 36 (SUTURE) ×12
SUT PDS AB 0 CTX 60 (SUTURE) ×3 IMPLANT
SUT PLAIN 0 NONE (SUTURE) IMPLANT
SUT PLAIN 2 0 (SUTURE)
SUT PLAIN 2 0 XLH (SUTURE) ×3 IMPLANT
SUT PLAIN ABS 2-0 CT1 27XMFL (SUTURE) IMPLANT
SUT VIC AB 4-0 KS 27 (SUTURE) ×3 IMPLANT
TOWEL OR 17X24 6PK STRL BLUE (TOWEL DISPOSABLE) ×3 IMPLANT
TRAY FOLEY W/BAG SLVR 14FR LF (SET/KITS/TRAYS/PACK) ×3 IMPLANT
WATER STERILE IRR 1000ML POUR (IV SOLUTION) ×3 IMPLANT

## 2020-01-04 NOTE — Transfer of Care (Signed)
Immediate Anesthesia Transfer of Care Note  Patient: Jamie Oconnor  Procedure(s) Performed: REPEAT CESAREAN SECTION WITH BILATERAL TUBAL LIGATION (Bilateral Abdomen)  Patient Location: PACU  Anesthesia Type:Spinal  Level of Consciousness: awake  Airway & Oxygen Therapy: Patient Spontanous Breathing  Post-op Assessment: Report given to RN and Post -op Vital signs reviewed and stable  Post vital signs: Reviewed and stable  Last Vitals:  Vitals Value Taken Time  BP 134/80 01/04/20 1430  Temp 36.6 C 01/04/20 1430  Pulse 55 01/04/20 1430  Resp 16 01/04/20 1430  SpO2 98 % 01/04/20 1430    Last Pain:  Vitals:   01/04/20 1430  TempSrc: Oral  PainSc: 0-No pain         Complications: No complications documented.

## 2020-01-04 NOTE — Anesthesia Procedure Notes (Signed)
Spinal  Patient location during procedure: OR Start time: 01/04/2020 11:13 AM End time: 01/04/2020 11:15 AM Staffing Performed: anesthesiologist  Anesthesiologist: Leilani Able, MD Preanesthetic Checklist Completed: patient identified, IV checked, site marked, risks and benefits discussed, surgical consent, monitors and equipment checked, pre-op evaluation and timeout performed Spinal Block Patient position: sitting Prep: DuraPrep and site prepped and draped Patient monitoring: continuous pulse ox and blood pressure Approach: midline Location: L3-4 Injection technique: single-shot Needle Needle type: Pencan  Needle gauge: 24 G Needle length: 10 cm Needle insertion depth: 6 cm Assessment Sensory level: T4

## 2020-01-04 NOTE — Brief Op Note (Signed)
01/04/2020  12:00 PM  PATIENT:  Jamie Oconnor  39 y.o. female  PRE-OPERATIVE DIAGNOSIS:  previous cesarean section, desires sterility  POST-OPERATIVE DIAGNOSIS:  previous cesarean section, desires sterility  PROCEDURE:  Procedure(s) with comments: REPEAT CESAREAN SECTION WITH BILATERAL TUBAL LIGATION (Bilateral) - Tracey RNFA  SURGEON:  Surgeon(s) and Role:    * Harold Hedge, MD - Primary  PHYSICIAN ASSISTANT:   ASSISTANTS: Mamie Levers RNFA   ANESTHESIA:   spinal  EBL:  50   BLOOD ADMINISTERED:none  DRAINS: Urinary Catheter (Foley)   LOCAL MEDICATIONS USED:  NONE  SPECIMEN:  Source of Specimen:  segments of bilateral fallopian tubes  DISPOSITION OF SPECIMEN:  PATHOLOGY  COUNTS:  YES  TOURNIQUET:  * No tourniquets in log *  DICTATION: .Other Dictation: Dictation Number [id number]  PLAN OF CARE: Admit to inpatient   PATIENT DISPOSITION:  PACU - hemodynamically stable.   Delay start of Pharmacological VTE agent (>24hrs) due to surgical blood loss or risk of bleeding: not applicable

## 2020-01-04 NOTE — Lactation Note (Addendum)
This note was copied from a baby's chart. Lactation Consultation Note  Patient Name: Jamie Oconnor CBULA'G Date: 01/04/2020 Reason for consult: Initial assessment;Mother's request;Term  Infant is term 5 hours old. Mom has previous experience with breastfeeding her first child for 4 months 13 years ago. Mom has latched infant 1x in L & D and again 2x on the floor. Infant has voided x1. Mom last fed 1 hour ago at the breast for 15 minutes. Mom has Medela, Motif and Lansinoh pumps at home.   Mom has soft compressible breast tissue. LC helped Mom to place baby STS to assist with the latch from cradle to cross cradle on the left side. Infant sleepy at the breast and left STS with Mom. LC reviewed I's/O's sheet with parents. LC breastfeeding supplementation guide, hand expression and spoon feeding reviewed with parents. LC reviewed with parents not to use pacifier for first 4 weeks to establish a good latch.   LC set up the manual breast pump. Reviewed assembly, cleaning and storage with parents.   Plan 1. To feed infant based on cues 8-12x in 24 hour period, no more than 4 hours without an attempt. Mom to offer both breasts at each feed keeping infant STS during feeding.             2. Mom to pump using manual q 3hours for 10 minutes on each breast.           3. Mom to offer any EBM collected in snappies via spoon feeding.            4. LC brochure inpatient and outpatient services reviewed with parents.

## 2020-01-04 NOTE — Op Note (Signed)
Jamie Oconnor, [place] MEDICAL RECORD IO:[id number] ACCOUNT [id number] DATE OF BIRTH:Jan 02, 1981 FACILITY: [facility] LOCATION: [facility] PHYSICIAN:Valentine Kuechle EHenderson Cloud II, MD  OPERATIVE REPORT  DATE OF PROCEDURE:  01/04/2020  PREOPERATIVE DIAGNOSES: 1.  Desires repeat cesarean section. 2.  Desires permanent sterilization.  POSTOPERATIVE DIAGNOSES:   1.  Desires repeat cesarean section.  2.  Desires permanent sterilization.  PROCEDURE: 1.  Repeat cesarean section. 2.  Bilateral tubal ligation.  SURGEON:  Harold Hedge, MD   ANESTHESIA:  Spinal, Leilani Able, MD  ASSISTANT:  Mamie Levers, RNFA.  ESTIMATED BLOOD LOSS:  50 mL.  SPECIMENS:  Segments of bilateral fallopian tubes to pathology.  FINDINGS:  Viable infant.  Apgars, arterial cord pH, birth weight pending.  INDICATIONS AND CONSENT:  This patient is a 39 year old G4 P1 at 39-6/7 weeks with previous cesarean section.  She desires repeat.  She also desires permanent sterilization.  Prenatal care has been complicated by advanced maternal age with normal  panorama.  Risks are discussed preoperatively, including but not limited to infection, organ damage, bleeding requiring transfusion of blood products with HIV and hepatitis acquisition, DVT, PE, pneumonia.  Tubal ligation alternative methods of  contraception permanence, failure rate and increased ectopic risk have also been reviewed.  The patient states she understands and agrees and all questions were answered and consent was signed on the chart.  DESCRIPTION OF PROCEDURE:  The patient was taken to the operating room.  She was identified.  Spinal anesthetic was placed and she was placed in the dorsal supine position with a 15-degree left lateral wedge.  Traxi was used to elevate the panniculus.   She was prepped vaginally with Betadine,  Foley catheter placed and prepped abdominally with ChloraPrep.  Timeout was undertaken.  After drying time of minutes, she was draped in a sterile  fashion.  After testing for adequate spinal anesthesia, skin was  entered through the previous Pfannenstiel scar and dissection was carried out in layers to the peritoneum, which was taken down superiorly and inferiorly.  Vesicouterine peritoneum was dissected and the bladder flap was dissected down.  Uterus was  incised in a low transverse manner and the uterine cavity was entered bluntly with a hemostat.  Clear fluid was noted.  Incision was extended with the fingers.  Vertex was then delivered and the remainder of the baby was delivered.  Good cry and tone was  noted.  Cord was clamped and cut after 1 minute and the baby was handed to waiting pediatrics team.  Placenta was manually delivered.  Cavity was clean.  Uterus was closed in 2 running locking imbricating layers of 0 Monocryl suture, which achieved good  hemostasis.  The left fallopian tube was identified from cornu to fimbria, grasped in its mid ampullary portion with a Babcock clamp and a knuckle of tube was ligated with 2 free ties of plain suture.  The intervening knuckle was then sharply resected.   Cautery was used to assure hemostasis.  Similar procedure was carried out on the right.  Both ovaries appear normal.  Lavage was carried out.  Anterior peritoneum was closed in a running fashion with 0 Monocryl suture, which was also used to  reapproximate the pyramidalis muscle in the midline.  Anterior rectus fascia was closed in a running fashion with a 0 looped PDS.  Subcutaneous layer was closed with interrupted plain.  Skin was closed in a subcuticular fashion with 4-0 Vicryl on a Keith  needle.  Benzoin, Steri-Strips, honeycomb and pressure dressings were applied.  All counts were correct and the patient was taken to the recovery room in stable condition.  VN/NUANCE  D:01/04/2020 T:01/04/2020 JOB:[id number]

## 2020-01-04 NOTE — Anesthesia Preprocedure Evaluation (Signed)
Anesthesia Evaluation  Patient identified by MRN, date of birth, ID band Patient awake    Reviewed: Allergy & Precautions, H&P , NPO status , Patient's Chart, lab work & pertinent test results  Airway Mallampati: III  TM Distance: >3 FB Neck ROM: full    Dental no notable dental hx.    Pulmonary neg pulmonary ROS,    Pulmonary exam normal breath sounds clear to auscultation       Cardiovascular negative cardio ROS Normal cardiovascular exam Rhythm:regular Rate:Normal     Neuro/Psych PSYCHIATRIC DISORDERS Depression negative neurological ROS     GI/Hepatic negative GI ROS, Neg liver ROS,   Endo/Other  Morbid obesity  Renal/GU negative Renal ROS  negative genitourinary   Musculoskeletal negative musculoskeletal ROS (+)   Abdominal (+) + obese,   Peds  Hematology negative hematology ROS (+)   Anesthesia Other Findings   Reproductive/Obstetrics (+) Pregnancy                             Anesthesia Physical Anesthesia Plan  ASA: III  Anesthesia Plan: Spinal   Post-op Pain Management:    Induction:   PONV Risk Score and Plan: 3 and Ondansetron, Dexamethasone and Scopolamine patch - Pre-op  Airway Management Planned: Nasal Cannula and Natural Airway  Additional Equipment: None  Intra-op Plan:   Post-operative Plan:   Informed Consent: I have reviewed the patients History and Physical, chart, labs and discussed the procedure including the risks, benefits and alternatives for the proposed anesthesia with the patient or authorized representative who has indicated his/her understanding and acceptance.       Plan Discussed with: CRNA  Anesthesia Plan Comments:         Anesthesia Quick Evaluation

## 2020-01-04 NOTE — Anesthesia Postprocedure Evaluation (Signed)
Anesthesia Post Note  Patient: Engineer, manufacturing systems  Procedure(s) Performed: REPEAT CESAREAN SECTION WITH BILATERAL TUBAL LIGATION (Bilateral Abdomen)     Patient location during evaluation: PACU Anesthesia Type: Spinal Level of consciousness: awake Pain management: pain level controlled Vital Signs Assessment: post-procedure vital signs reviewed and stable Respiratory status: spontaneous breathing Cardiovascular status: stable Postop Assessment: no headache, no backache, spinal receding, patient able to bend at knees and no apparent nausea or vomiting Anesthetic complications: no   No complications documented.  Last Vitals:  Vitals:   01/04/20 1430 01/04/20 1530  BP: 134/80 (!) 134/92  Pulse: (!) 55 60  Resp: 16 18  Temp: 36.6 C 36.4 C  SpO2: 98% 98%    Last Pain:  Vitals:   01/04/20 1530  TempSrc: Oral  PainSc: 0-No pain   Pain Goal:                Epidural/Spinal Function Cutaneous sensation: Normal sensation (01/04/20 1530), Patient able to flex knees: Yes (01/04/20 1530), Patient able to lift hips off bed: Yes (01/04/20 1530), Back pain beyond tenderness at insertion site: No (01/04/20 1530), Progressively worsening motor and/or sensory loss: No (01/04/20 1530), Bowel and/or bladder incontinence post epidural: No (01/04/20 1530)  Caren Macadam

## 2020-01-04 NOTE — Progress Notes (Signed)
Called to patients room due to IV beeping. Upon assessment IV has come out of patients hand. Pt informed RN that it took 5 or 6 attempts to get first IV. RN called MD Henderson Cloud who agreed that if she can maintain adequate PO intake and has adequate output that she does not have to have another IV. RN informed patient and will report off to oncoming RN.   Melvern Banker RN

## 2020-01-04 NOTE — H&P (Signed)
Jamie Oconnor is a 39 y.o. female presenting for repeat cesarean section and BTL. She is convinced she wants no more children. Pregnancy complicated by AMA with normal panorama. OB History    Gravida  4   Para  1   Term      Preterm      AB  2   Living        SAB  2   TAB      Ectopic      Multiple      Live Births             Past Medical History:  Diagnosis Date  . Depression    PPD no meds   Past Surgical History:  Procedure Laterality Date  . CESAREAN SECTION    . DILATION AND CURETTAGE OF UTERUS    . POLYPECTOMY     uterine   Family History: family history includes Cancer in her paternal grandmother; Diabetes in her maternal grandmother; Hypertension in her maternal grandmother; Hyperthyroidism in her mother; Hypothyroidism in her maternal grandmother, mother, and sister. Social History:  reports that she has never smoked. She has never used smokeless tobacco. She reports current alcohol use. She reports that she does not use drugs.     Maternal Diabetes: No Genetic Screening: Normal Maternal Ultrasounds/Referrals: Normal Fetal Ultrasounds or other Referrals:  None Maternal Substance Abuse:  No Significant Maternal Medications:  None Significant Maternal Lab Results:  Group B Strep negative Other Comments:  None  Review of Systems  Constitutional: Negative for fever.  Eyes: Negative for visual disturbance.  Gastrointestinal: Negative for abdominal pain.  Neurological: Negative for headaches.   Maternal Medical History:  Fetal activity: Perceived fetal activity is normal.        Blood pressure (!) 142/95, pulse 70, temperature 97.9 F (36.6 C), temperature source Oral, resp. rate 18, height 5\' 9"  (1.753 m), weight (!) 144.2 kg, SpO2 100 %. Maternal Exam:  Abdomen: Patient reports no abdominal tenderness.   Physical Exam Cardiovascular:     Rate and Rhythm: Normal rate.  Pulmonary:     Effort: Pulmonary effort is normal.     Prenatal  labs: ABO, Rh: --/--/A POS (10/05 1644) Antibody: NEG (10/05 1644) Rubella:   RPR: Reactive (10/05 1644)  HBsAg:    HIV:    GBS:   negative  Assessment/Plan: 39 yo G4P2 @ 39 6/7 for repeat C/S and BTL Risks have been reviewed including infection, organ damage, bleeding/transfusion-HIV/Hep, DVT/PE, pneumonia, wound breakdown. BTL discussed including permanence, failure rate and increased ectopic risk. She states she understands and wants BTL.    09-06-2003 II 01/04/2020, 10:26 AM

## 2020-01-05 LAB — CBC
HCT: 35.4 % — ABNORMAL LOW (ref 36.0–46.0)
Hemoglobin: 11.4 g/dL — ABNORMAL LOW (ref 12.0–15.0)
MCH: 29.2 pg (ref 26.0–34.0)
MCHC: 32.2 g/dL (ref 30.0–36.0)
MCV: 90.8 fL (ref 80.0–100.0)
Platelets: 186 10*3/uL (ref 150–400)
RBC: 3.9 MIL/uL (ref 3.87–5.11)
RDW: 14.3 % (ref 11.5–15.5)
WBC: 10.3 10*3/uL (ref 4.0–10.5)
nRBC: 0 % (ref 0.0–0.2)

## 2020-01-05 NOTE — Progress Notes (Signed)
CSW received consult for hx of Anxiety.  CSW met with MOB to offer support and complete assessment.    CSW met with MOB at bedside and discussed reason for consult. MOB was welcoming, pleasant and remained engaged during assessment. CSW and MOB discussed MOB's mental health history. MOB reported that she probably had postpartum depression with her daughter and she just dealt with it on her own. MOB described her postpartum depression as feeling sad and alone. MOB reported that it lasted for a couple months and it went away on it's own. MOB reported that she wouldn't say she has a history of anxiety or depression but that she has experienced anxiety and depression. MOB reported that she has not been diagnosed with anxiety or depression. MOB reported that she has been experiencing anxiety and depression ongoing throughout the pregnancy. MOB described her depression as being sad and easy to cry. MOB reported that her anxiety is just racing thoughts. MOB reported that she is not taking medication or participating in therapy to treat anxiety/depression. CSW inquired about MOB's coping skills, MOB reported that she just stays busy with work. CSW inquired about how MOB was feeling emotionally after giving birth, MOB reported that she was feeling great. CSW inquired about MOB's support system, MOB reported that FOB, her 13 year old daughter and mom are her supports. MOB reported that they have all items needed to care for infant including a car seat, crib and basinet. MOB presented calm and did not demonstrate any acute mental health signs/symptoms. CSW assessed for safety, MOB denied SI, HI and domestic violence.   CSW provided education regarding the baby blues period vs. perinatal mood disorders, discussed treatment and gave resources for mental health follow up if concerns arise.  CSW recommends self-evaluation during the postpartum time period using the New Mom Checklist from Postpartum Progress and encouraged MOB  to contact a medical professional if symptoms are noted at any time.    CSW provided review of Sudden Infant Death Syndrome (SIDS) precautions.    CSW identifies no further need for intervention and no barriers to discharge at this time.  Baby Stairs, LCSW Clinical Social Worker [facility] Cell#: [phone]   

## 2020-01-05 NOTE — Lactation Note (Signed)
This note was copied from a baby's chart. Lactation Consultation Note  Patient Name: Jamie Oconnor WIOXB'D Date: 01/05/2020 Reason for consult: Follow-up assessment;Term  Follow up visit with 33 hours old infant with 3.21% of weight loss of a P2 mother. Mother states infant has an inconsistent latch. Mother explains she was given a manual pump but she has not used it much. Mother know how to hand expressed but it is only getting a few drops of colostrum.   Used a drop of colostrum to check infant's sucking. Noted lack of tongue extension and tongue thrusting. Mother mentions she had difficulty breastfeeding her first child because he will pop on and off nipple when trying to latch.   Attempted latch cradle position to left breast. Infant is thrusting nipple out with her tongue. Demonstrated teacup hold while doing a modified cradle. Able to latch infant. Noted rhythmic suckling and some swallowing. Encouraged mother to support infant back and allow some neck extension. Infant able to sustain latch for ~20 minutes. Nipple looks slightly compressed after unlatching.  DEBP set up and encouraged using to stimulate her breasts. Reviewed milk storage and proper care of parts.   Infant fell asleep. Encouraged to burp and offer right breast. Infant started crying inconsolably. Attempted latch to right breast, infant is disorganized and frantic. Tried hand expression to collect some colostrum, unable to get anything. Attempted latch with NS 24 mm but unsuccessful.   Parents decide to supplement with formula. Educated about supplementation volume and pace bottle feeding, frequent burping and upright position.   Parents verbalized understanding.   Encouraged to contact [facility] for support when ready to breastfeed baby and recommended to request help for questions or concerns.    All questions answered at this time.   Maternal Data Formula Feeding for Exclusion: No Has patient been taught Hand  Expression?: Yes Does the patient have breastfeeding experience prior to this delivery?: Yes  Feeding Feeding Type: Breast Fed  LATCH Score Latch: Repeated attempts needed to sustain latch, nipple held in mouth throughout feeding, stimulation needed to elicit sucking reflex.  Audible Swallowing: A few with stimulation  Type of Nipple: Everted at rest and after stimulation (short nipple)  Comfort (Breast/Nipple): Soft / non-tender  Hold (Positioning): Assistance needed to correctly position infant at breast and maintain latch.  LATCH Score: 7  Interventions Interventions: Breast feeding basics reviewed;Assisted with latch;Skin to skin;Breast massage;Hand express;Adjust position;DEBP;Position options;Support pillows  Lactation Tools Discussed/Used Flange Size: 27 Pump Review: Setup, frequency, and cleaning;Milk Storage Initiated by:: Tayden Duran IBCLC Date initiated:: 01/06/20   Consult Status Consult Status: Follow-up Date: 01/06/20 Follow-up type: In-patient    Roosvelt Churchwell A Higuera Ancidey 01/05/2020, 9:17 PM

## 2020-01-05 NOTE — Progress Notes (Signed)
Subjective: Postpartum Day 1: Cesarean Delivery Patient reports tolerating PO.    Objective: Vital signs in last 24 hours: Temp:  [97.5 F (36.4 C)-98.6 F (37 C)] 97.9 F (36.6 C) (10/09 0623) Pulse Rate:  [47-70] 53 (10/09 0623) Resp:  [11-18] 18 (10/09 0623) BP: (101-143)/(56-95) 118/74 (10/09 0623) SpO2:  [95 %-100 %] 97 % (10/09 0623) Weight:  [144.2 kg] 144.2 kg (10/08 0842)  Physical Exam:  General: alert, cooperative and no distress Lochia: appropriate Uterine Fundus: firm Incision: healing well DVT Evaluation: No evidence of DVT seen on physical exam.  No results for input(s): HGB, HCT in the last 72 hours.  Assessment/Plan: Status post Cesarean section. Doing well postoperatively.  Continue current care.  Roselle Locus II 01/05/2020, 7:47 AM

## 2020-01-06 MED ORDER — OXYCODONE HCL 5 MG PO TABS: 5.0000 mg | ORAL_TABLET | Freq: Four times a day (QID) | ORAL | 0 refills | Status: DC | PRN

## 2020-01-06 MED ORDER — IBUPROFEN 600 MG PO TABS: 600.0000 mg | ORAL_TABLET | Freq: Four times a day (QID) | ORAL | 0 refills | Status: DC | PRN

## 2020-01-06 NOTE — Lactation Note (Signed)
This note was copied from a baby's chart. Lactation Consultation Note Baby 39 hrs old. LC attempted to see mom. everyone in room sleeping. Heard snoring.  Patient Name: Jamie Oconnor [id number] Date: 01/06/2020     Maternal Data    Feeding    LATCH Score                   Interventions    Lactation Tools Discussed/Used     Consult Status      Jamie Oconnor 01/06/2020, 2:58 AM

## 2020-01-06 NOTE — Lactation Note (Signed)
This note was copied from a baby's chart. Lactation Consultation Note Baby 41 hrs old. OBGYN is d/c mom. Mom using DEBP while MD giving d/c orders reviewing d/c are.  LC praised mom for pumping. Mom states she has 3 pumps at home. Mom stated she has no questions about BF/Pumping. Mom is BF/bottle feeding formula.  Reviewed breast filling, engorgement, management, importance of I&O documentation, composition of BM, support groups and OP LC services., newborn feeding habits and behavior.  Mom states she feels confident about going home w/baby today.  Patient Name: Jamie Oconnor BTDHR'C Date: 01/06/2020 Reason for consult: Follow-up assessment   Maternal Data    Feeding Feeding Type: Breast Fed  LATCH Score Latch:  (Requested MOB call out for latch score)                 Interventions Interventions: Breast feeding basics reviewed;DEBP  Lactation Tools Discussed/Used Tools: Pump Breast pump type: Double-Electric Breast Pump   Consult Status Consult Status: Complete Date: 01/06/20    Charyl Dancer 01/06/2020, 6:16 AM

## 2020-01-06 NOTE — Discharge Summary (Signed)
Postpartum Discharge Summary  Date of Service updated 01/06/20     Patient Name: Jamie Oconnor DOB: [birth year] MRN: [id number]  Date of admission: 01/04/2020 Delivery date:01/04/2020  Delivering provider: Everlene Farrier  Date of discharge: 01/06/2020  Admitting diagnosis: History of cesarean section [Z98.891] Intrauterine pregnancy: [redacted]w[redacted]d    Secondary diagnosis:  Active Problems:   History of cesarean section  Additional problems:     Discharge diagnosis: Term Pregnancy Delivered                                              Post partum procedures: Augmentation: N/A Complications: None  Hospital course: Sceduled C/S   39y.o. yo G4P1021 at 39w6das admitted to the hospital 01/04/2020 for scheduled cesarean section with the following indication:Elective Repeat.Delivery details are as follows:  Membrane Rupture Time/Date: 11:36 AM ,01/04/2020   Delivery Method:C-Section, Low Transverse  Details of operation can be found in separate operative note.  Patient had an uncomplicated postpartum course.  She is ambulating, tolerating a regular diet, passing flatus, and urinating well. Patient is discharged home in stable condition on  01/06/20        Newborn Data: Birth date:01/04/2020  Birth time:11:37 AM  Gender:Female  Living status:Living  Apgars:8 ,9  Weight:3550 g     Magnesium Sulfate received: No BMZ received: No Rhophylac:No MMR:No T-DaP:Given prenatally Flu: No Transfusion:No  Physical exam  Vitals:   01/05/20 1559 01/05/20 2006 01/05/20 2100 01/06/20 0544  BP: 135/88 139/79 139/88 137/79  Pulse: 64 (!) 52 62 60  Resp: 18 15 18 18   Temp: 97.9 F (36.6 C) 97.9 F (36.6 C) 98.6 F (37 C) 97.6 F (36.4 C)  TempSrc: Oral Oral Oral Oral  SpO2: 99% 100% 100% 100%  Weight:      Height:       General: alert, cooperative and no distress Lochia: appropriate Uterine Fundus: firm Incision: Healing well with no significant drainage DVT Evaluation: No evidence of  DVT seen on physical exam. Labs: Lab Results  Component Value Date   WBC 10.3 01/05/2020   HGB 11.4 (L) 01/05/2020   HCT 35.4 (L) 01/05/2020   MCV 90.8 01/05/2020   PLT 186 01/05/2020   CMP Latest Ref Rng & Units 06/27/2015  Glucose 65 - 99 mg/dL 142(H)  BUN 6 - 20 mg/dL 12  Creatinine 0.44 - 1.00 mg/dL 0.95  Sodium 135 - 145 mmol/L 135  Potassium 3.5 - 5.1 mmol/L 3.1(L)  Chloride 101 - 111 mmol/L 96(L)  CO2 22 - 32 mmol/L 25  Calcium 8.9 - 10.3 mg/dL 9.1  Total Protein 6.5 - 8.1 g/dL 8.5(H)  Total Bilirubin 0.3 - 1.2 mg/dL 0.4  Alkaline Phos 38 - 126 U/L 64  AST 15 - 41 U/L 25  ALT 14 - 54 U/L 21   Edinburgh Score: Edinburgh Postnatal Depression Scale Screening Tool 01/06/2020  I have been able to laugh and see the funny side of things. 0  I have looked forward with enjoyment to things. 0  I have blamed myself unnecessarily when things went wrong. 2  I have been anxious or worried for no good reason. 1  I have felt scared or panicky for no good reason. 1  Things have been getting on top of me. 1  I have been so unhappy that I have had difficulty sleeping. 1  I have felt sad or miserable. 1  I have been so unhappy that I have been crying. 1  The thought of harming myself has occurred to me. 0  Edinburgh Postnatal Depression Scale Total 8      After visit meds:  Allergies as of 01/06/2020      Reactions   Vanilla Other (See Comments)   (Vanilla Bean--muscle tensions/headaches)      Medication List    STOP taking these medications   celecoxib 200 MG capsule Commonly known as: CeleBREX   cyclobenzaprine 10 MG tablet Commonly known as: FLEXERIL   famotidine 20 MG tablet Commonly known as: PEPCID     TAKE these medications   acetaminophen 325 MG tablet Commonly known as: TYLENOL Take 325-650 mg by mouth every 6 (six) hours as needed (headaches (severe)).   ibuprofen 600 MG tablet Commonly known as: ADVIL Take 1 tablet (600 mg total) by mouth every 6  (six) hours as needed for moderate pain.   oxyCODONE 5 MG immediate release tablet Commonly known as: Oxy IR/ROXICODONE Take 1-2 tablets (5-10 mg total) by mouth every 6 (six) hours as needed for severe pain.   prenatal vitamin w/FE, FA 27-1 MG Tabs tablet Take 1 tablet by mouth daily with lunch.        Discharge home in stable condition Infant Feeding: Breast Infant Disposition:home with mother Discharge instruction: per After Visit Summary and Postpartum booklet. Activity: Advance as tolerated. Pelvic rest for 6 weeks.  Diet: routine diet Anticipated Birth Control: Unsure Postpartum Appointment:6 weeks Additional Postpartum F/U:  Future Appointments:No future appointments. Follow up Visit:      01/06/2020 Allena Katz, MD

## 2020-01-07 LAB — SURGICAL PATHOLOGY

## 2020-01-31 ENCOUNTER — Other Ambulatory Visit: Payer: Self-pay

## 2020-01-31 ENCOUNTER — Ambulatory Visit (INDEPENDENT_AMBULATORY_CARE_PROVIDER_SITE_OTHER): Payer: 59 | Admitting: Family Medicine

## 2020-01-31 ENCOUNTER — Ambulatory Visit: Payer: Self-pay

## 2020-01-31 ENCOUNTER — Encounter: Payer: Self-pay | Admitting: Family Medicine

## 2020-01-31 DIAGNOSIS — G8929 Other chronic pain: Secondary | ICD-10-CM | POA: Diagnosis not present

## 2020-01-31 DIAGNOSIS — M545 Low back pain, unspecified: Secondary | ICD-10-CM | POA: Diagnosis not present

## 2020-01-31 MED ORDER — HYDROCODONE-ACETAMINOPHEN 5-325 MG PO TABS: 0.5000 | ORAL_TABLET | Freq: Two times a day (BID) | ORAL | 0 refills | Status: DC | PRN

## 2020-01-31 MED ORDER — CELECOXIB 200 MG PO CAPS: 200.0000 mg | ORAL_CAPSULE | Freq: Two times a day (BID) | ORAL | 6 refills | Status: AC | PRN

## 2020-01-31 MED ORDER — CYCLOBENZAPRINE HCL 10 MG PO TABS: 10.0000 mg | ORAL_TABLET | Freq: Three times a day (TID) | ORAL | 3 refills | Status: DC | PRN

## 2020-01-31 NOTE — Progress Notes (Signed)
Office Visit Note   Patient: Jamie Oconnor           Date of Birth: 24-Mar-1981           MRN: [id number] Visit Date: 01/31/2020 Requested by: Harold Hedge, MD [address] [place],  [place] [address] PCP: Harold Hedge, MD  Subjective: Chief Complaint  Patient presents with   Lower Back - Pain    Right-sided back pain. Has had a baby since the last visit. The last 2 months of the pregnancy were painful. Pain is mostly in the right buttock area, occasionally radiating up to the bra-line of her back.    HPI: She is here for follow-up chronic low back and right posterior hip pain.  Since last visit she had a baby.  During her pregnancy she was very uncomfortable with increasing pain in the right buttocks area.  She delivered the baby a month ago and still has pain although not quite as bad.  She has just finished breast-feeding her baby.  She is not taking anything for her current pain.                ROS:   All other systems were reviewed and are negative.  Objective: Vital Signs: There were no vitals taken for this visit.  Physical Exam:  General:  Alert and oriented, in no acute distress. Pulm:  Breathing unlabored. Psy:  Normal mood, congruent affect.  Low back: She is tender primarily in the right gluteus medius area.  She does have some pain in the sciatic notch.  Straight leg raise is negative, no pain with internal hip rotation.  Lower extremity strength and reflexes are normal.  Imaging: XR Lumbar Spine 2-3 Views  Result Date: 01/31/2020 X-rays lumbar spine reveal advanced degenerative disc disease at L2-3 and L5-S1.  No sign of compression fracture or neoplasm.  SI joints and right hip are unremarkable, she has early degenerative spurring in the left hip.   Assessment & Plan: 1.  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 -We will order an MRI scan lumbar spine to further evaluate.  Prescriptions called in for  pain and muscle spasm.  She will use pain medicine very sparingly. -Could contemplate physical therapy, chiropractic, or epidural injection depending on MRI findings.     Procedures: No procedures performed  No notes on file     PMFS History: Patient Active Problem List   Diagnosis Date Noted   History of cesarean section 01/04/2020   Past Medical History:  Diagnosis Date   Depression    PPD no meds    Family History  Problem Relation Age of Onset   Hyperthyroidism Mother    Hypothyroidism Mother    Hypothyroidism Sister    Diabetes Maternal Grandmother    Hypertension Maternal Grandmother    Hypothyroidism Maternal Grandmother    Cancer Paternal Grandmother     Past Surgical History:  Procedure Laterality Date   CESAREAN SECTION     CESAREAN SECTION WITH BILATERAL TUBAL LIGATION Bilateral 01/04/2020   Procedure: REPEAT CESAREAN SECTION WITH BILATERAL TUBAL LIGATION;  Surgeon: Harold Hedge, MD;  Location: [facility];  Service: Obstetrics;  Laterality: Bilateral;  Tracey RNFA   DILATION AND CURETTAGE OF UTERUS     POLYPECTOMY     uterine   Social History   Occupational History   Not on file  Tobacco Use   Smoking status: Never Smoker   Smokeless tobacco: Never  Used  Substance and Sexual Activity   Alcohol use: Yes    Comment: rare   Drug use: No   Sexual activity: Yes    Birth control/protection: I.U.D.

## 2020-02-07 ENCOUNTER — Telehealth: Payer: Self-pay | Admitting: Family Medicine

## 2020-02-07 NOTE — Telephone Encounter (Signed)
Patient called requesting a new pain medication. Patient states medication prescribed is not touching the pain. Please send new pain medication to pharmacy on file. Patient phone number is [phone].

## 2020-02-08 ENCOUNTER — Other Ambulatory Visit: Payer: Self-pay | Admitting: Family Medicine

## 2020-02-08 DIAGNOSIS — M545 Low back pain, unspecified: Secondary | ICD-10-CM

## 2020-02-08 DIAGNOSIS — G8929 Other chronic pain: Secondary | ICD-10-CM

## 2020-02-08 MED ORDER — OXYCODONE-ACETAMINOPHEN 5-325 MG PO TABS: 1.0000 | ORAL_TABLET | Freq: Every evening | ORAL | 0 refills | Status: DC | PRN

## 2020-02-08 NOTE — Telephone Encounter (Signed)
Please advise. Was given Norco 5/325, Celebrex and cyclobenzaprine.

## 2020-02-08 NOTE — Telephone Encounter (Signed)
Rx sent, use very sparingly. 

## 2020-02-08 NOTE — Telephone Encounter (Signed)
I called and advised the patient.

## 2020-02-13 ENCOUNTER — Other Ambulatory Visit: Payer: Self-pay | Admitting: Family Medicine

## 2020-02-13 DIAGNOSIS — M545 Low back pain, unspecified: Secondary | ICD-10-CM

## 2020-02-24 ENCOUNTER — Other Ambulatory Visit: Payer: 59

## 2020-02-28 ENCOUNTER — Telehealth: Payer: Self-pay | Admitting: Family Medicine

## 2020-02-28 MED ORDER — HYDROCODONE-ACETAMINOPHEN 5-325 MG PO TABS: 0.5000 | ORAL_TABLET | Freq: Every evening | ORAL | 0 refills | Status: DC | PRN

## 2020-02-28 NOTE — Telephone Encounter (Signed)
Please advise 

## 2020-02-28 NOTE — Telephone Encounter (Signed)
Pt called requesting some pain medication to be sent in for her back.

## 2020-02-28 NOTE — Telephone Encounter (Signed)
Sent!

## 2020-02-29 NOTE — Telephone Encounter (Signed)
Patient was alerted of this by the pharmacy yesterday.

## 2020-03-17 ENCOUNTER — Telehealth: Payer: Self-pay | Admitting: Family Medicine

## 2020-03-17 MED ORDER — HYDROCODONE-ACETAMINOPHEN 5-325 MG PO TABS: 0.5000 | ORAL_TABLET | Freq: Every evening | ORAL | 0 refills | Status: DC | PRN

## 2020-03-17 NOTE — Addendum Note (Signed)
Addended by: Lillia Carmel on: 03/17/2020 02:24 PM   Modules accepted: Orders

## 2020-03-17 NOTE — Telephone Encounter (Signed)
Please advise 

## 2020-03-17 NOTE — Telephone Encounter (Signed)
Refill on pain medication (percocet) for back.

## 2020-03-17 NOTE — Telephone Encounter (Signed)
I called and left voice mail that this was done. 

## 2020-04-11 ENCOUNTER — Telehealth: Payer: Self-pay

## 2020-04-11 MED ORDER — OXYCODONE-ACETAMINOPHEN 5-325 MG PO TABS: 1.0000 | ORAL_TABLET | Freq: Every evening | ORAL | 0 refills | Status: DC | PRN

## 2020-04-11 NOTE — Telephone Encounter (Signed)
Tried calling the patient to advise her the medication was sent in. The call would not go through. The patient can check with the pharmacy.

## 2020-04-11 NOTE — Addendum Note (Signed)
Addended by: Lillia Carmel on: 04/11/2020 11:30 AM   Modules accepted: Orders

## 2020-04-11 NOTE — Telephone Encounter (Signed)
Patient called she is requesting rx refill for oxycodone. CB:[phone]

## 2020-04-11 NOTE — Telephone Encounter (Signed)
Done

## 2020-04-11 NOTE — Telephone Encounter (Signed)
Please advise 

## 2020-04-15 ENCOUNTER — Telehealth: Payer: Self-pay

## 2020-04-15 MED ORDER — OXYCODONE-ACETAMINOPHEN 5-325 MG PO TABS: 1.0000 | ORAL_TABLET | Freq: Every evening | ORAL | 0 refills | Status: DC | PRN

## 2020-04-15 NOTE — Addendum Note (Signed)
Addended by: Lillia Carmel on: 04/15/2020 04:24 PM   Modules accepted: Orders

## 2020-04-15 NOTE — Telephone Encounter (Signed)
Patient called she is requesting rx refill for oxycodone . [phone]

## 2020-04-15 NOTE — Telephone Encounter (Signed)
The patient was given only 5 tabs on 04/12/19, as it is a new year (for insurance to pay for it). She has 1 left for today.

## 2020-04-15 NOTE — Telephone Encounter (Signed)
Left voice mail advising patient this was sent in to the pharmacy.

## 2020-04-15 NOTE — Telephone Encounter (Signed)
Sent!

## 2020-04-28 ENCOUNTER — Telehealth: Payer: Self-pay | Admitting: Family Medicine

## 2020-04-28 NOTE — Telephone Encounter (Signed)
Pt called stating she's been having trouble picking up her percocets due to her insurance but she has recently updated her insurance and would like for Dr.Xu to send in a rx for her   [phone]

## 2020-04-29 MED ORDER — OXYCODONE-ACETAMINOPHEN 5-325 MG PO TABS: 1.0000 | ORAL_TABLET | Freq: Every evening | ORAL | 0 refills | Status: DC | PRN

## 2020-04-29 NOTE — Telephone Encounter (Signed)
I called and advised the patient the Rx has been sent in. I did let her know that it still may require prior authorization with this new insurance - will have to wait and see.

## 2020-04-29 NOTE — Telephone Encounter (Signed)
The patient now has [employer] (faxing [place] a copy of her card). Of the 04/15/20 Rx for Percocet, she could only get #5 (had medicaid then - insurance denied paying for the full #20). The patient is asking for another Rx for #20 - she is taking it very sparingly, for severe pain only. Please advise.

## 2020-05-01 ENCOUNTER — Telehealth: Payer: Self-pay | Admitting: Family Medicine

## 2020-05-01 NOTE — Telephone Encounter (Signed)
Patient called advised the Rx for Percocet need prior approval before the pharmacy will fill. The number to contact patient is [phone]

## 2020-05-02 ENCOUNTER — Telehealth: Payer: Self-pay | Admitting: Family Medicine

## 2020-05-02 NOTE — Telephone Encounter (Signed)
I called and left voice mail for patient to call me back. Still have not received [employer]. Tried the pharmacy to see if they have started the prior authorization - rang over 4 minutes without anyone picking up. If I have the correct insurance information, I can start the prior authorization request. Awaiting that call back &/or info from the patient.

## 2020-05-02 NOTE — Telephone Encounter (Signed)
I called. See other message.

## 2020-05-02 NOTE — Telephone Encounter (Signed)
Patient returned a call to Terri. Would like a call back to [phone]

## 2020-05-02 NOTE — Telephone Encounter (Signed)
The patient can get a 7-day supply of the percocet, as this is the first Rx for this with this new insurance. She will check back with the pharmacy for this. Should be able to get a full Rx (for more days) after this initial amount of #7.

## 2020-05-08 ENCOUNTER — Telehealth: Payer: Self-pay

## 2020-05-08 DIAGNOSIS — M545 Low back pain, unspecified: Secondary | ICD-10-CM

## 2020-05-08 NOTE — Telephone Encounter (Signed)
Pt would like a CB from terri in regards to paperwork that will allow the pt to get her percocet rx without prior [employer].  [phone]

## 2020-05-09 ENCOUNTER — Telehealth: Payer: Self-pay | Admitting: Family Medicine

## 2020-05-09 NOTE — Telephone Encounter (Signed)
Patient called returning Terri phone call. Patient asked for a call back at [phone].

## 2020-05-09 NOTE — Telephone Encounter (Signed)
I called and reached the patient's voice mail - left message to call me back. We still have not received the fax with her new insurance information Administrator).

## 2020-05-09 NOTE — Telephone Encounter (Signed)
Tried calling this number twice -- the call could not be completed.

## 2020-05-13 NOTE — Telephone Encounter (Signed)
Tried calling the patient again - number not in service.

## 2020-05-14 MED ORDER — OXYCODONE-ACETAMINOPHEN 5-325 MG PO TABS: 1.0000 | ORAL_TABLET | Freq: Every evening | ORAL | 0 refills | Status: DC | PRN

## 2020-05-14 NOTE — Telephone Encounter (Signed)
I will send it in this time, but I can't keep prescribing this for her.  I have placed orders for pain clinic referral to take over medication management.

## 2020-05-14 NOTE — Telephone Encounter (Signed)
See other message regarding the patient's Percocet Rx.

## 2020-05-14 NOTE — Telephone Encounter (Signed)
The patient was able to only get #7 of the Percocet Rx on 05/02/20 ( I also spoke with Baxter Hire at the patient's pharmacy, who confirmed this). She has not had any for a few days now. She does now have a new insurance - it may or may not require prior authorization for getting more than just a quantity of 7 at a time. We will not know until the pharmacy runs a new Rx through.  Can the patient get a new Rx for Percocet?

## 2020-05-14 NOTE — Telephone Encounter (Signed)
I called and advised the patient of the Rx and the referral. She voiced understanding. I did receive a prior authorization request from the pharmacy. I told the patient I would work on this and let her know of the outcome.

## 2020-05-16 NOTE — Telephone Encounter (Signed)
Patient called requesting a pre authorization has not been sent to pharmacy. Please call patient at [phone]. Please send to pharmacy on file

## 2020-05-16 NOTE — Telephone Encounter (Signed)
I called and advised the patient we have not received an answer yet on the Percocet. It was submitted 48 hours ago -- the message through CoverMyMeds said it could take up to 72 hours to consider the prior authorization request. Will hold on to this message to keep a check on this.

## 2020-05-16 NOTE — Telephone Encounter (Signed)
I called [employer] and spoke with Shatara. She did approve the generic Percocet # 20 for 6 months, until 11/13/2020. I called and spoke with the pharmacy. They ran it through while I was on the phone - it went through fine. I called the patient back and advised her she can now pick up that Rx from the pharmacy.

## 2020-06-05 ENCOUNTER — Telehealth: Payer: Self-pay | Admitting: Family Medicine

## 2020-06-05 DIAGNOSIS — M545 Low back pain, unspecified: Secondary | ICD-10-CM

## 2020-06-05 NOTE — Telephone Encounter (Signed)
Pt would like a refill on percocet

## 2020-06-06 ENCOUNTER — Telehealth: Payer: Self-pay | Admitting: Family Medicine

## 2020-06-06 NOTE — Telephone Encounter (Signed)
Will defer to Dr. Prince Rome

## 2020-06-06 NOTE — Telephone Encounter (Signed)
Please advise 

## 2020-06-06 NOTE — Telephone Encounter (Signed)
Patient called a few days ago about refill of percets. Pt asking for another doctor to fill medication if Dr. Prince Rome is out of office. Please send to pharmacy on file. Patient phone number is [phone].

## 2020-06-06 NOTE — Telephone Encounter (Signed)
Can you please advise?

## 2020-06-09 MED ORDER — OXYCODONE-ACETAMINOPHEN 5-325 MG PO TABS: 1.0000 | ORAL_TABLET | Freq: Every evening | ORAL | 0 refills | Status: DC | PRN

## 2020-06-09 NOTE — Telephone Encounter (Signed)
See below

## 2020-06-09 NOTE — Telephone Encounter (Signed)
Can you tell which pain management location she was referred? From what I can see, they have tried contacting the patient twice -- the patient says she has not heard anything from them. She said she would call them back, if we can provide a phone number for her.

## 2020-06-09 NOTE — Addendum Note (Signed)
Addended by: Lillia Carmel on: 06/09/2020 08:05 AM   Modules accepted: Orders

## 2020-06-10 ENCOUNTER — Encounter: Payer: Self-pay | Admitting: Physical Medicine and Rehabilitation

## 2020-06-11 NOTE — Telephone Encounter (Signed)
Tried calling back voice mail not set up. If pt calls back the number to contact pain management is [phone]

## 2020-07-07 ENCOUNTER — Telehealth: Payer: Self-pay | Admitting: Family Medicine

## 2020-07-07 ENCOUNTER — Other Ambulatory Visit: Payer: Self-pay | Admitting: Family Medicine

## 2020-07-07 MED ORDER — OXYCODONE-ACETAMINOPHEN 5-325 MG PO TABS: 1.0000 | ORAL_TABLET | Freq: Every evening | ORAL | 0 refills | Status: DC | PRN

## 2020-07-07 NOTE — Telephone Encounter (Signed)
Patient called requesting a refill of Oxychodone. Please send to pharmacy. Patient phone number is [phone].

## 2020-07-07 NOTE — Telephone Encounter (Signed)
Her appt with Dr. Carlis Abbott (pain management) is not until 07/31/20. Please advise.

## 2020-07-07 NOTE — Telephone Encounter (Signed)
I called and advised the patient. 

## 2020-07-07 NOTE — Telephone Encounter (Signed)
Rx sent.  Needs to make it last until her pain management visit.

## 2020-07-31 ENCOUNTER — Telehealth: Payer: Self-pay

## 2020-07-31 ENCOUNTER — Encounter: Payer: 59 | Admitting: Physical Medicine and Rehabilitation

## 2020-07-31 NOTE — Telephone Encounter (Signed)
patient called she stated she had a appointment with pain management today but she had to reschedule due to the office telling her she couldn't bring her 6 mo. In with her and she doesn't have anyone that can watch her baby. Patient stated she rescheduled her appointment to next week she is requesting a rx for percocet to be called into the pharmacy until she goes to her appointment next week call [phone]

## 2020-07-31 NOTE — Telephone Encounter (Signed)
Reading her chart not comfortable giving her more percocet

## 2020-07-31 NOTE — Telephone Encounter (Signed)
Can you advise since Dr Prince Rome out of office?

## 2020-07-31 NOTE — Telephone Encounter (Signed)
Holding for Dr Hilts.  

## 2020-08-04 NOTE — Telephone Encounter (Signed)
Please advise 

## 2020-08-04 NOTE — Telephone Encounter (Signed)
I called and advised the patient. She will be seeing pain management this Friday.

## 2020-08-08 ENCOUNTER — Other Ambulatory Visit: Payer: Self-pay

## 2020-08-08 ENCOUNTER — Encounter: Payer: 59 | Attending: Physical Medicine and Rehabilitation | Admitting: Physical Medicine & Rehabilitation

## 2020-08-08 ENCOUNTER — Encounter: Payer: Self-pay | Admitting: Physical Medicine & Rehabilitation

## 2020-08-08 VITALS — BP 135/84 | HR 72 | Temp 97.8°F | Ht 69.0 in | Wt 306.4 lb

## 2020-08-08 DIAGNOSIS — M5136 Other intervertebral disc degeneration, lumbar region: Secondary | ICD-10-CM | POA: Insufficient documentation

## 2020-08-08 DIAGNOSIS — M5459 Other low back pain: Secondary | ICD-10-CM | POA: Diagnosis present

## 2020-08-08 DIAGNOSIS — G894 Chronic pain syndrome: Secondary | ICD-10-CM | POA: Diagnosis present

## 2020-08-08 NOTE — Patient Instructions (Addendum)
Will need to review urine screen prior to prescribing narcotic pain meds Back Exercises These exercises help to make your trunk and back strong. They also help to keep the lower back flexible. Doing these exercises can help to prevent back pain or lessen existing pain.  If you have back pain, try to do these exercises 2-3 times each day or as told by your doctor.  As you get better, do the exercises once each day. Repeat the exercises more often as told by your doctor.  To stop back pain from coming back, do the exercises once each day, or as told by your doctor. Exercises Single knee to chest Do these steps 3-5 times in a row for each leg: 1. Lie on your back on a firm bed or the floor with your legs stretched out. 2. Bring one knee to your chest. 3. Grab your knee or thigh with both hands and hold them it in place. 4. Pull on your knee until you feel a gentle stretch in your lower back or buttocks. 5. Keep doing the stretch for 10-30 seconds. 6. Slowly let go of your leg and straighten it. Pelvic tilt Do these steps 5-10 times in a row: 1. Lie on your back on a firm bed or the floor with your legs stretched out. 2. Bend your knees so they point up to the ceiling. Your feet should be flat on the floor. 3. Tighten your lower belly (abdomen) muscles to press your lower back against the floor. This will make your tailbone point up to the ceiling instead of pointing down to your feet or the floor. 4. Stay in this position for 5-10 seconds while you gently tighten your muscles and breathe evenly. Cat-cow Do these steps until your lower back bends more easily: 1. Get on your hands and knees on a firm surface. Keep your hands under your shoulders, and keep your knees under your hips. You may put padding under your knees. 2. Let your head hang down toward your chest. Tighten (contract) the muscles in your belly. Point your tailbone toward the floor so your lower back becomes rounded like the back  of a cat. 3. Stay in this position for 5 seconds. 4. Slowly lift your head. Let the muscles of your belly relax. Point your tailbone up toward the ceiling so your back forms a sagging arch like the back of a cow. 5. Stay in this position for 5 seconds.   Press-ups Do these steps 5-10 times in a row: 1. Lie on your belly (face-down) on the floor. 2. Place your hands near your head, about shoulder-width apart. 3. While you keep your back relaxed and keep your hips on the floor, slowly straighten your arms to raise the top half of your body and lift your shoulders. Do not use your back muscles. You may change where you place your hands in order to make yourself more comfortable. 4. Stay in this position for 5 seconds. 5. Slowly return to lying flat on the floor.   Bridges Do these steps 10 times in a row: 1. Lie on your back on a firm surface. 2. Bend your knees so they point up to the ceiling. Your feet should be flat on the floor. Your arms should be flat at your sides, next to your body. 3. Tighten your butt muscles and lift your butt off the floor until your waist is almost as high as your knees. If you do not feel the muscles working  in your butt and the back of your thighs, slide your feet 1-2 inches farther away from your butt. 4. Stay in this position for 3-5 seconds. 5. Slowly lower your butt to the floor, and let your butt muscles relax. If this exercise is too easy, try doing it with your arms crossed over your chest.   Belly crunches Do these steps 5-10 times in a row: 1. Lie on your back on a firm bed or the floor with your legs stretched out. 2. Bend your knees so they point up to the ceiling. Your feet should be flat on the floor. 3. Cross your arms over your chest. 4. Tip your chin a little bit toward your chest but do not bend your neck. 5. Tighten your belly muscles and slowly raise your chest just enough to lift your shoulder blades a tiny bit off of the floor. Avoid raising  your body higher than that, because it can put too much stress on your low back. 6. Slowly lower your chest and your head to the floor. Back lifts Do these steps 5-10 times in a row: 1. Lie on your belly (face-down) with your arms at your sides, and rest your forehead on the floor. 2. Tighten the muscles in your legs and your butt. 3. Slowly lift your chest off of the floor while you keep your hips on the floor. Keep the back of your head in line with the curve in your back. Look at the floor while you do this. 4. Stay in this position for 3-5 seconds. 5. Slowly lower your chest and your face to the floor. Contact a doctor if:  Your back pain gets a lot worse when you do an exercise.  Your back pain does not get better 2 hours after you exercise. If you have any of these problems, stop doing the exercises. Do not do them again unless your doctor says it is okay. Get help right away if:  You have sudden, very bad back pain. If this happens, stop doing the exercises. Do not do them again unless your doctor says it is okay. This information is not intended to replace advice given to you by your health care provider. Make sure you discuss any questions you have with your health care provider. Document Revised: 12/08/2017 Document Reviewed: 12/08/2017 Elsevier Patient Education  2021 [employer].

## 2020-08-08 NOTE — Progress Notes (Signed)
Subjective:    Patient ID: Jamie Oconnor, female    DOB: [birth year], 40 y.o.   MRN: [id number]  HPI  CC:  Worsening RIght sided low back pain 40 year old female who states she has had back problems since she was around 40 years old.  She played a lot of sports in high school.  She has not had any back surgery.  She has tried some physical therapy last year but this was not particularly effective for her.  She has pain that occurs on a daily basis and a mild to moderate level.  Her pain does interfere with sleep at night and this is usually when she takes sleep medication. Pain started increasing ~last trimester.  Gave birth ~34mo ago elective C section No pain going into legs, no numbness or tingling in the legs or feet. 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 General activity 7 Relation with others 6 Enjoyment of life 7 What TIME of day is your pain at its worst? morning  and evening Sleep (in general) Good  Pain is worse with: bending, some activites and lifting & pushing Pain improves with: heat/ice and medication Relief from Meds: 7  walk without assistance ability to climb steps?  yes do you drive?  yes Do you have any goals in this area?  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  yes  spasms depression anxiety  Any changes since last visit?   yes Xray by Dr.  Any changes since last visit?  no New Patient    Family History  Problem Relation Age of Onset  . Hyperthyroidism Mother   . Hypothyroidism Mother   . Hypothyroidism Sister   . Diabetes Maternal Grandmother   . Hypertension Maternal Grandmother   . Hypothyroidism Maternal Grandmother   . Cancer Paternal Grandmother    Social History   Socioeconomic History  . Marital status: Married    Spouse name: Not on file  . Number of children: Not on file  . Years of education: Not on file  . Highest education level: Not on file  Occupational History  . Not on file  Tobacco Use  . Smoking status: Never Smoker  . Smokeless tobacco: Never Used  Vaping Use  . Vaping Use: Some days  . Substances: CBD, Flavoring  Substance and Sexual Activity  . Alcohol use: Yes    Comment: rare  . Drug use: No  . Sexual activity: Yes    Birth control/protection: I.U.D.  Other Topics Concern  . Not on file  Social History Narrative  . Not on file   Social Determinants of Health   Financial Resource Strain: Not on file  Food Insecurity: Not on file  Transportation Needs: Not on file  Physical Activity: Not on file  Stress: Not on file  Social Connections: Not on file   Past Surgical History:  Procedure Laterality Date  . CESAREAN SECTION    . CESAREAN SECTION WITH BILATERAL TUBAL LIGATION Bilateral 01/04/2020   Procedure: REPEAT CESAREAN SECTION WITH BILATERAL TUBAL LIGATION;  Surgeon: Harold Hedge, MD;  Location: [facility];  Service: Obstetrics;  Laterality: Bilateral;  Tracey RNFA  . DILATION AND CURETTAGE OF UTERUS    . POLYPECTOMY     uterine   Past Medical History:  Diagnosis Date  . Depression    PPD no meds   BP 135/84   Pulse 72   Temp 97.8 F (36.6 C)   Ht 5\' 9"  (1.753 m)   Wt (!) 306 lb 6.4 oz (139 kg)   SpO2 99%   BMI 45.25 kg/m   Opioid Risk Score:   Fall Risk Score:  `1  Depression screen PHQ 2/9  Depression screen PHQ 2/9  08/08/2020  Decreased Interest 1  Down, Depressed, Hopeless 1  PHQ - 2 Score 2  Altered sleeping 1  Tired, decreased energy 2  Change in appetite 2  Feeling bad or failure about yourself  2  Trouble concentrating 1  Moving slowly or fidgety/restless 0  Suicidal thoughts 0  PHQ-9 Score 10   Review of Systems  Musculoskeletal: Positive for back pain.       Right Shoulder Pain also grinds Right Hip Pain  All other systems reviewed and are negative.      Objective:   Physical Exam Vitals and nursing note reviewed.  Constitutional:      General: She is not in acute distress.    Appearance: She is obese.  HENT:     Head: Normocephalic and atraumatic.  Eyes:     Extraocular Movements: Extraocular movements intact.     Conjunctiva/sclera: Conjunctivae normal.     Pupils: Pupils are equal, round, and reactive to light.  Cardiovascular:     Heart sounds: Normal heart sounds. No murmur heard.   Pulmonary:     Effort: Pulmonary effort is normal. No respiratory distress.     Breath sounds: Normal breath sounds.  Abdominal:     General: Abdomen is flat. Bowel sounds are normal. There is no distension.     Palpations: Abdomen is soft.  Musculoskeletal:        General: Tenderness present. No swelling.     Comments: There is tenderness starting at around the L3 lumbar paraspinals on the right side going down to PSIS area. Has good lumbar flexion without pain she does have pain with lumbar extension although with good range of motion she does have pain with right lateral bending.  No pain with twisting  Skin:    General: Skin is warm and dry.  Neurological:     Mental Status: She is oriented to person, place, and time.     Comments: Motor strength is 5/5 bilateral deltoid, bicep, tricep, grip, hip flexor, knee extensor, ankle dorsiflexor plantar flexor  Negative straight leg raising bilaterally Sensation normal bilateral L4-5 S1 dermatomal distribution Deep tendon reflexes are 2+  bilateral knees and ankles Gait without abnormalities no evidence of toe drag or knee instability no antalgia  Psychiatric:        Mood and Affect: Mood normal.        Behavior: Behavior normal.           Assessment & Plan:  #1.  Moderate right-sided axial back pain without sciatica.  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  She has some pain in this area but also has pain inferior to this area and more associated with extension than with flexion.  Therefore appears to be lumbar facet mediated pain is unintentional and perhaps the major pain generator.  She has tried physical therapy as well as narcotic analgesics without significant relief.  Recommend diagnostic L3-4-5 medial branch blocks under fluoroscopic guidance if greater than 50% relief x2 occasions would proceed with radiofrequency neurotomy of the same nerves. 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 We discussed using narcotic analgesics only at night and would first trial tramadol as the patient has not tried this 1.  We discussed advantages of this medication over oxycodone with reduced side effects.  We will need to check UDS I will see the patient back in 3 weeks for the injection.

## 2020-08-13 ENCOUNTER — Telehealth: Payer: Self-pay | Admitting: *Deleted

## 2020-08-13 LAB — TOXASSURE SELECT,+ANTIDEPR,UR

## 2020-08-13 NOTE — Telephone Encounter (Signed)
Urine drug screen is positive for THC. °

## 2020-08-15 ENCOUNTER — Other Ambulatory Visit: Payer: Self-pay | Admitting: *Deleted

## 2020-08-15 NOTE — Telephone Encounter (Signed)
Per Dr Wynn Banker: "Inform pt of non narcotic management ".  I have mailed a letter informing Jamie Oconnor of her non narcotic status,

## 2020-09-19 ENCOUNTER — Other Ambulatory Visit: Payer: Self-pay

## 2020-09-19 ENCOUNTER — Encounter: Payer: 59 | Attending: Physical Medicine and Rehabilitation | Admitting: Physical Medicine & Rehabilitation

## 2020-09-19 ENCOUNTER — Encounter: Payer: Self-pay | Admitting: Physical Medicine & Rehabilitation

## 2020-09-19 VITALS — Temp 98.2°F | Ht 69.0 in | Wt 301.8 lb

## 2020-09-19 DIAGNOSIS — M5459 Other low back pain: Secondary | ICD-10-CM | POA: Diagnosis present

## 2020-09-19 NOTE — Patient Instructions (Addendum)
Lumbar medial branch blocks were performed. This is to help diagnose the cause of the low back pain. It is important that you keep track of your pain for the first day or 2 after injection. This injection can give you temporary relief that lasts for hours or up to several months. There is no way to predict duration of pain relief.  Please try to compare your pain after injection to for the injection.  If this injection gives you  temporary relief there may be another longer-lasting procedure that may be beneficial call radiofrequency ablation   Please call us Monday

## 2020-09-19 NOTE — Progress Notes (Signed)
  PROCEDURE RECORD [facility]   Name: Jamie Oconnor DOB:Aug 12, 1980 MRN: [id number]  Date:09/19/2020  Physician: Claudette Laws, MD    Nurse/CMA: Nedra Hai, CMA  Allergies:  Allergies  Allergen Reactions   Vanilla Other (See Comments)    (Vanilla Bean--muscle tensions/headaches)    Consent Signed: Yes.    Is patient diabetic? No.  CBG today?   Pregnant: No. LMP: No LMP recorded. (age 40-55)  Anticoagulants: no Anti-inflammatory: yes (Celebrex) Antibiotics: no  Procedure: Right L3-4-5 Medial Branch Block  Position: Prone Start Time: 3:27 pm  End Time: 3:36 pm  Fluoro Time: 49  RN/CMA Shanda Cadotte, CMA Chritopher Coster,CMA    Time 3:15 pm 3:45 pm    BP 152/74 132/89    Pulse 65 62    Respirations 16 16    SpO2 96 98    S/S 6 6    Pain Level 7/10 4/10     D/C home with no one, patient A & O X 3, D/C instructions reviewed, and sits independently.

## 2020-09-19 NOTE — Progress Notes (Signed)
Right lumbar L3, L4 medial branch blocks and L5 dorsal ramus injection under fluoroscopic guidance  Indication: Right Lumbar pain which is not relieved by medication management or other conservative care and interfering with self-care and mobility.  Informed consent was obtained after describing risks and benefits of the procedure with the patient, this includes bleeding, bruising, infection, paralysis and medication side effects. The patient wishes to proceed and has given written consent. The patient was placed in a prone position. The lumbar area was marked and prepped with Betadine. One ML of 1% lidocaine was injected into each of 3 areas into the skin and subcutaneous tissue. Then a 22-gauge 5in  spinal needle was inserted targeting the junction of the Right S1 superior articular process and sacral ala junction. Needle was advanced under fluoroscopic guidance. Bone contact was made.Isovue 200 was injected x0.5 mL demonstrating no intravascular uptake. Then a solution containing 2% MPF lidocaine was injected x0.5 mL. Then the Right L5 superior articular process in transverse process junction was targeted. Bone contact was made.Isovue 200 was injected x0.5 mL demonstrating no intravascular uptake. Then a solution containing 2% MPF lidocaine was injected x0.5 mL. Then the Right L4 superior articular process in transverse process junction was targeted. Bone contact was made. Isovue 200 was injected x0.5 mL demonstrating no intravascular uptake. Then a solution containing2% MPF lidocaine was injected x0.5 mL Patient tolerated procedure well. Post procedure instructions were given. Please refer to post procedure form. 

## 2020-12-05 ENCOUNTER — Other Ambulatory Visit: Payer: Self-pay | Admitting: Surgical

## 2020-12-05 ENCOUNTER — Telehealth: Payer: Self-pay

## 2020-12-05 MED ORDER — CYCLOBENZAPRINE HCL 10 MG PO TABS: 10.0000 mg | ORAL_TABLET | Freq: Three times a day (TID) | ORAL | 3 refills | Status: AC | PRN

## 2020-12-05 NOTE — Telephone Encounter (Signed)
Refill request for cyclobenzaprine 10mg  ([facility] patient) Can you advise?

## 2020-12-05 NOTE — Telephone Encounter (Signed)
refilled
